# Patient Record
Sex: Male | Born: 1990 | Race: White | Hispanic: No | Marital: Single | Smoking: Never smoker
Health system: Southern US, Community
[De-identification: ages and names within clinical notes are randomized; demographics above are authoritative.]

## PROBLEM LIST (undated history)

## (undated) DIAGNOSIS — F419 Anxiety disorder, unspecified: Secondary | ICD-10-CM

## (undated) DIAGNOSIS — F32A Depression, unspecified: Secondary | ICD-10-CM

## (undated) DIAGNOSIS — F329 Major depressive disorder, single episode, unspecified: Secondary | ICD-10-CM

## (undated) DIAGNOSIS — S069X9A Unspecified intracranial injury with loss of consciousness of unspecified duration, initial encounter: Secondary | ICD-10-CM

## (undated) DIAGNOSIS — S069XAA Unspecified intracranial injury with loss of consciousness status unknown, initial encounter: Secondary | ICD-10-CM

---

## 2016-04-29 ENCOUNTER — Emergency Department
Admission: EM | Admit: 2016-04-29 | Discharge: 2016-04-29 | Disposition: A | Discharge status: Home / Self Care | Payer: Medicare Other | Attending: Emergency Medicine | Admitting: Emergency Medicine

## 2016-04-29 ENCOUNTER — Encounter: Payer: Self-pay | Admitting: *Deleted

## 2016-04-29 DIAGNOSIS — Y92009 Unspecified place in unspecified non-institutional (private) residence as the place of occurrence of the external cause: Secondary | ICD-10-CM | POA: Insufficient documentation

## 2016-04-29 DIAGNOSIS — Y999 Unspecified external cause status: Secondary | ICD-10-CM | POA: Diagnosis not present

## 2016-04-29 DIAGNOSIS — F419 Anxiety disorder, unspecified: Secondary | ICD-10-CM | POA: Insufficient documentation

## 2016-04-29 DIAGNOSIS — F329 Major depressive disorder, single episode, unspecified: Secondary | ICD-10-CM | POA: Insufficient documentation

## 2016-04-29 DIAGNOSIS — Y9389 Activity, other specified: Secondary | ICD-10-CM | POA: Insufficient documentation

## 2016-04-29 HISTORY — DX: Anxiety disorder, unspecified: F41.9

## 2016-04-29 HISTORY — DX: Major depressive disorder, single episode, unspecified: F32.9

## 2016-04-29 HISTORY — DX: Depression, unspecified: F32.A
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
## 2016-04-29 NOTE — ED Notes (Signed)
Resumed c are from BJ'sStephen RN.  Pt alert, calm and cooperative.  Pt on cell phone.  Pt is dressed and is waiting on transportation to group home per stephen rn report.

## 2016-04-29 NOTE — ED Notes (Signed)
Pt continues to wait on ride to group home.  Pt is on cell phone.
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
## 2016-04-29 NOTE — ED Notes (Signed)
D/c inst to group home caregiver and pt. Pt alert, calm and cooperative at time of discharge.
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
## 2016-11-26 ENCOUNTER — Emergency Department
Admission: EM | Admit: 2016-11-26 | Discharge: 2016-11-26 | Disposition: A | Discharge status: Home / Self Care | Payer: Medicare Other | Attending: Emergency Medicine | Admitting: Emergency Medicine

## 2016-11-26 ENCOUNTER — Emergency Department: Payer: Medicare Other

## 2016-11-26 DIAGNOSIS — R079 Chest pain, unspecified: Secondary | ICD-10-CM | POA: Diagnosis present

## 2016-11-26 LAB — BASIC METABOLIC PANEL
Anion gap: 6 (ref 5–15)
BUN: 12 mg/dL (ref 6–20)
CALCIUM: 9.6 mg/dL (ref 8.9–10.3)
CO2: 30 mmol/L (ref 22–32)
Chloride: 103 mmol/L (ref 101–111)
Creatinine, Ser: 0.78 mg/dL (ref 0.61–1.24)
GFR calc Af Amer: >60 mL/min (ref >60)
GFR calc non Af Amer: >60 mL/min (ref >60)
GLUCOSE: 79 mg/dL (ref 65–99)
Potassium: 4 mmol/L (ref 3.5–5.1)
Sodium: 139 mmol/L (ref 135–145)

## 2016-11-26 LAB — CBC
HCT: 44.2 % (ref 40.0–52.0)
Hemoglobin: 14.9 g/dL (ref 13.0–18.0)
MCH: 30.5 pg (ref 26.0–34.0)
MCHC: 33.7 g/dL (ref 32.0–36.0)
MCV: 90.6 fL (ref 80.0–100.0)
PLATELETS: 251 10*3/uL (ref 150–440)
RBC: 4.87 MIL/uL (ref 4.40–5.90)
RDW: 13.6 % (ref 11.5–14.5)
WBC: 6 10*3/uL (ref 3.8–10.6)

## 2016-11-26 LAB — TROPONIN I

## 2016-11-26 MED ORDER — RANITIDINE HCL 150 MG PO TABS: 150.0000 mg | ORAL_TABLET | Freq: Two times a day (BID) | ORAL | 1 refills | Status: DC

## 2016-11-26 NOTE — ED Provider Notes (Signed)
[name]   ____________________________________________   I have reviewed the triage vital signs and the nursing notes.   HISTORY  Chief Complaint Chest Pain   History limited by: Not Limited   HPI Eugene Silva is a 26 y.o. male who presents to the emergency department today because of chest pain. Is located in the left lower chest. He states it is been happening on and off for a few weeks. Today it happened 4 times. The pain is sharp. It is only around for 2-3 minutes. It is not accompanied by any shortness breath. No nausea or vomiting. Today's worse episode happen shortly after he ate some ice cream. He denies seen anyone for this in the past.   Past Medical History:  Diagnosis Date  . Anxiety   . Depression     There are no active problems to display for this patient.   History reviewed. No pertinent surgical history.  Prior to Admission medications   Not on File    Allergies Patient has no known allergies.  No family history on file.  Social History Social History  Substance Use Topics  . Smoking status: Never Smoker  . Smokeless tobacco: Not on file  . Alcohol use No    Review of Systems  Constitutional: Negative for fever. Cardiovascular: Positive for chest pain. Respiratory: Negative for shortness of breath. Gastrointestinal: Negative for abdominal pain, vomiting and diarrhea. Neurological: Negative for headaches, focal weakness or numbness.  10-point ROS otherwise negative.  ____________________________________________   PHYSICAL EXAM:  VITAL SIGNS: ED Triage Vitals  Enc Vitals Group     BP 11/26/16 1628 122/76     Pulse Rate 11/26/16 1628 81     Resp 11/26/16 1628 18     Temp 11/26/16 1628 98.2 F (36.8 C)     Temp Source 11/26/16 1628 Oral     SpO2 11/26/16 1628 98 %     Weight 11/26/16 1628 150 lb (68 kg)     Height 11/26/16 1628 5\' 7"  (1.702 m)     Head  Circumference --      Peak Flow --      Pain Score 11/26/16 1738 0     Pain Loc --     Constitutional: Alert and oriented. Well appearing and in no distress. Eyes: Conjunctivae are normal. Normal extraocular movements. ENT   Head: Normocephalic and atraumatic.   Nose: No congestion/rhinnorhea.   Mouth/Throat: Mucous membranes are moist.   Neck: No stridor. Hematological/Lymphatic/Immunilogical: No cervical lymphadenopathy. Cardiovascular: Normal rate, regular rhythm.  No murmurs, rubs, or gallops.  Respiratory: Normal respiratory effort without tachypnea nor retractions. Breath sounds are clear and equal bilaterally. No wheezes/rales/rhonchi. Gastrointestinal: Soft and non tender. No rebound. No guarding.  Genitourinary: Deferred Musculoskeletal: Normal range of motion in all extremities. No lower extremity edema. Neurologic:  Normal speech and language. No gross focal neurologic deficits are appreciated.  Skin:  Skin is warm, dry and intact. No rash noted. Psychiatric: Mood and affect are normal. Speech and behavior are normal. Patient exhibits appropriate insight and judgment.  ____________________________________________    LABS (pertinent positives/negatives)  Labs Reviewed  BASIC METABOLIC PANEL  CBC  TROPONIN I     ____________________________________________   EKG  I, Phineas Semen, attending physician, personally viewed and interpreted this EKG  EKG Time: 1628 Rate: 71 Rhythm: normal sinus rhythm Axis: normal Intervals: qtc 415 QRS: narrow, LVH ST changes: no st elevation Impression: abnormal ekg   ____________________________________________  RADIOLOGY  CXR IMPRESSION: Normal exam.   ____________________________________________   PROCEDURES  Procedures  ____________________________________________   INITIAL IMPRESSION / ASSESSMENT AND PLAN / ED COURSE  Pertinent labs & imaging results that were available during my care  of the patient were reviewed by me and considered in my medical decision making (see chart for details).  Patient presented to the emergency department today for intermittent sharp chest pain. EKG, CXR and blood work without concerning findings. I think at this point acid reflux likely. Doubt ACS, PE, PTX, PNA. Will discharge with antacid.   ____________________________________________   FINAL CLINICAL IMPRESSION(S) / ED DIAGNOSES  Final diagnoses:  Chest pain, unspecified type     Note: This dictation was prepared with Dragon dictation. Any transcriptional errors that result from this process are unintentional     Phineas SemenGraydon Tyquavious Gamel, MD 11/26/16 [id number]

## 2016-11-26 NOTE — Discharge Instructions (Signed)
Please seek medical attention for any high fevers, chest pain, shortness of breath, change in behavior, persistent vomiting, bloody stool or any other new or concerning symptoms.  

## 2016-11-26 NOTE — ED Triage Notes (Signed)
Pt c/o CP intermittently since this AM. Pt alert and oriented X4, active, cooperative, pt in NAD. RR even and unlabored, color WNL.  Denies pain at this time.

## 2018-11-18 ENCOUNTER — Emergency Department
Admission: EM | Admit: 2018-11-18 | Discharge: 2018-11-18 | Disposition: A | Discharge status: Home / Self Care | Payer: Medicare Other | Attending: Emergency Medicine | Admitting: Emergency Medicine

## 2018-11-18 ENCOUNTER — Emergency Department: Payer: Medicare Other

## 2018-11-18 ENCOUNTER — Other Ambulatory Visit: Payer: Self-pay

## 2018-11-18 DIAGNOSIS — Z79899 Other long term (current) drug therapy: Secondary | ICD-10-CM | POA: Insufficient documentation

## 2018-11-18 DIAGNOSIS — E041 Nontoxic single thyroid nodule: Secondary | ICD-10-CM

## 2018-11-18 DIAGNOSIS — S0990XA Unspecified injury of head, initial encounter: Secondary | ICD-10-CM | POA: Insufficient documentation

## 2018-11-18 DIAGNOSIS — Y92513 Shop (commercial) as the place of occurrence of the external cause: Secondary | ICD-10-CM | POA: Insufficient documentation

## 2018-11-18 DIAGNOSIS — Y998 Other external cause status: Secondary | ICD-10-CM | POA: Diagnosis not present

## 2018-11-18 DIAGNOSIS — Z23 Encounter for immunization: Secondary | ICD-10-CM | POA: Diagnosis not present

## 2018-11-18 DIAGNOSIS — R079 Chest pain, unspecified: Secondary | ICD-10-CM | POA: Diagnosis not present

## 2018-11-18 DIAGNOSIS — M542 Cervicalgia: Secondary | ICD-10-CM | POA: Insufficient documentation

## 2018-11-18 DIAGNOSIS — Y939 Activity, unspecified: Secondary | ICD-10-CM | POA: Insufficient documentation

## 2018-11-18 HISTORY — DX: Unspecified intracranial injury with loss of consciousness status unknown, initial encounter: S06.9XAA

## 2018-11-18 HISTORY — DX: Unspecified intracranial injury with loss of consciousness of unspecified duration, initial encounter: S06.9X9A

## 2018-11-18 MED ORDER — TETANUS-DIPHTH-ACELL PERTUSSIS 5-2.5-18.5 LF-MCG/0.5 IM SUSP
0.5000 mL | Freq: Once | INTRAMUSCULAR | Status: AC
Start: 2018-11-18 — End: 2018-11-18
  Administered 2018-11-18: 0.5 mL via INTRAMUSCULAR
  Filled 2018-11-18: qty 0.5

## 2018-11-18 MED ORDER — BACITRACIN-NEOMYCIN-POLYMYXIN 400-5-5000 EX OINT: 1.0000 "application " | TOPICAL_OINTMENT | Freq: Two times a day (BID) | CUTANEOUS | 0 refills | Status: AC

## 2018-11-18 MED ORDER — IBUPROFEN 600 MG PO TABS: 600.0000 mg | ORAL_TABLET | Freq: Four times a day (QID) | ORAL | 0 refills | Status: AC | PRN
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
## 2019-09-16 ENCOUNTER — Other Ambulatory Visit: Payer: Self-pay | Admitting: Otolaryngology

## 2019-09-16 DIAGNOSIS — E041 Nontoxic single thyroid nodule: Secondary | ICD-10-CM

## 2019-09-22 ENCOUNTER — Ambulatory Visit: Payer: Medicare Other

## 2019-09-30 ENCOUNTER — Ambulatory Visit: Payer: Medicare Other

## 2019-10-05 ENCOUNTER — Ambulatory Visit
Admission: RE | Admit: 2019-10-05 | Discharge: 2019-10-05 | Disposition: A | Discharge status: Home / Self Care | Payer: Medicare Other | Source: Ambulatory Visit | Attending: Otolaryngology | Admitting: Otolaryngology

## 2019-10-05 ENCOUNTER — Other Ambulatory Visit: Payer: Self-pay

## 2019-10-05 DIAGNOSIS — E041 Nontoxic single thyroid nodule: Secondary | ICD-10-CM | POA: Diagnosis present

## 2019-10-07 ENCOUNTER — Other Ambulatory Visit: Payer: Self-pay | Admitting: Otolaryngology

## 2019-10-07 DIAGNOSIS — E041 Nontoxic single thyroid nodule: Secondary | ICD-10-CM

## 2019-10-17 ENCOUNTER — Ambulatory Visit: Payer: Medicare Other

## 2019-10-18 ENCOUNTER — Other Ambulatory Visit: Payer: Self-pay

## 2019-10-18 ENCOUNTER — Ambulatory Visit
Admission: RE | Admit: 2019-10-18 | Discharge: 2019-10-18 | Disposition: A | Discharge status: Home / Self Care | Payer: Medicare Other | Source: Ambulatory Visit | Attending: Otolaryngology | Admitting: Otolaryngology

## 2019-10-18 DIAGNOSIS — E041 Nontoxic single thyroid nodule: Secondary | ICD-10-CM | POA: Diagnosis not present

## 2019-10-18 NOTE — Procedures (Signed)
Interventional Radiology Procedure:   Indications: Indeterminate thyroid nodule  Procedure: US guided thyroid FNA  Findings: 5 FNAs from right nodule  Complications: none     EBL: less than 10 ml  Plan: Discharge to home.   Dera Vanaken R. Anselm Pancoast, MD  Pager: [phone]

## 2019-10-31 ENCOUNTER — Encounter: Payer: Self-pay | Admitting: Otolaryngology

## 2019-10-31 LAB — CYTOLOGY - NON PAP

## 2019-12-24 IMAGING — US US THYROID
1 series · 13 of 25 positions shown · non-contrast
Comparison: Cervical spine CT 11/18/2018

CLINICAL DATA: Thyroid nodule seen on cervical spine CT.

EXAM:
THYROID ULTRASOUND
TECHNIQUE: Ultrasound examination of the thyroid gland and adjacent soft
tissues was performed.

[Series 1: us thyroid · 0.07mm/px · 13 of 47 slices shown]
[im 1/47]
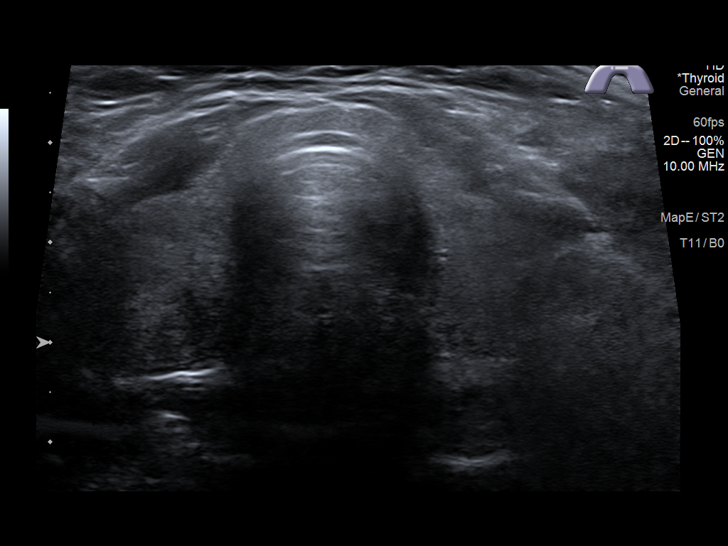
[im 4/47]
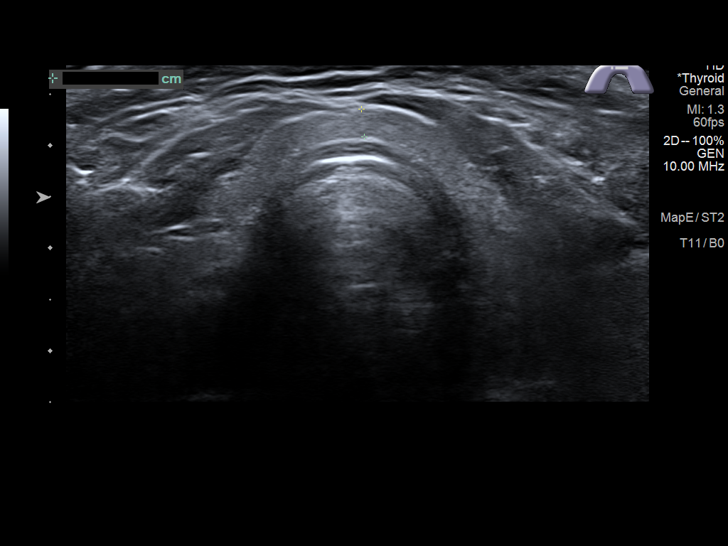
[im 8/47]
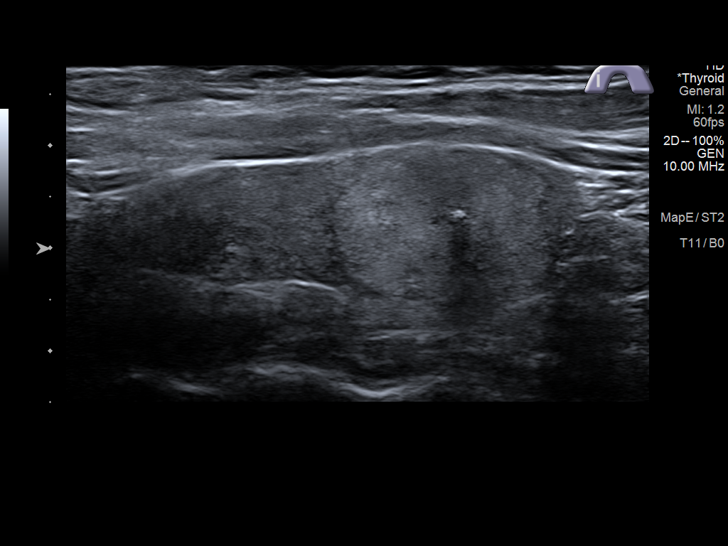
[im 12/47]
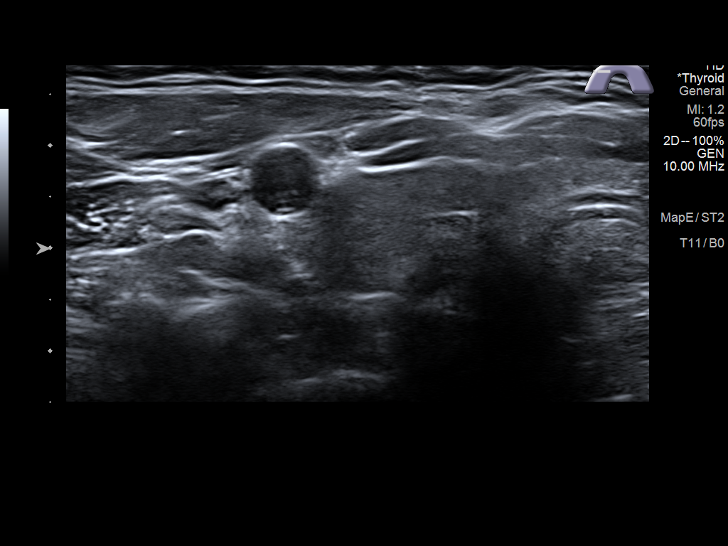
[im 16/47]
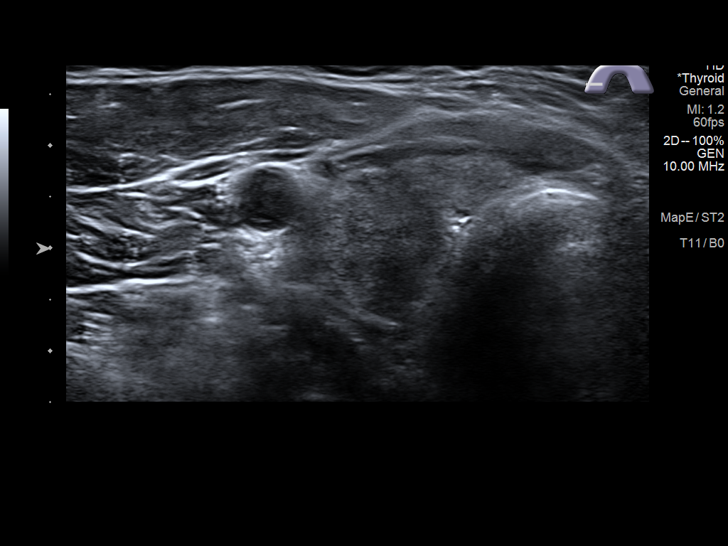
[im 20/47]
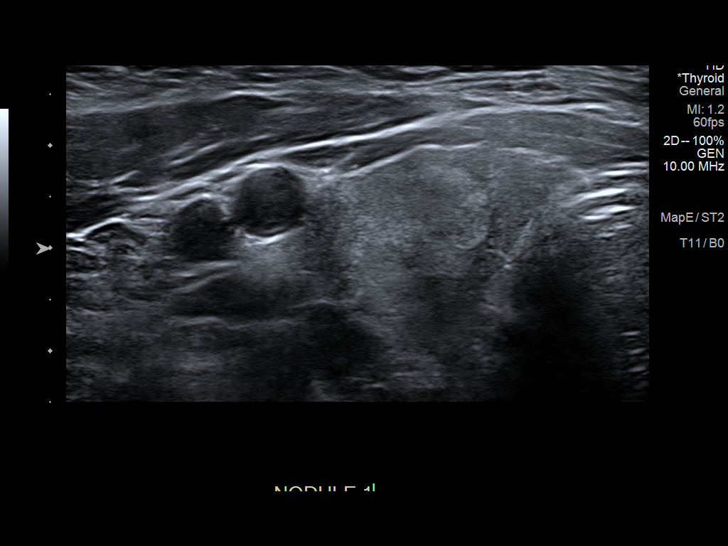
[im 24/47]
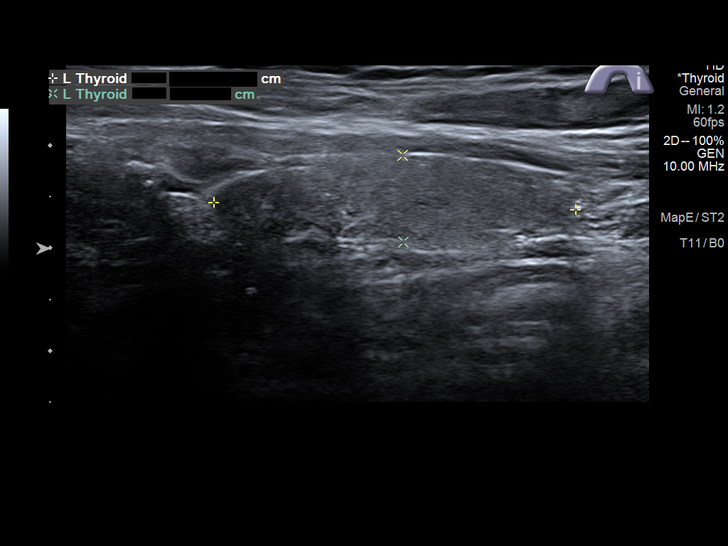
[im 27/47]
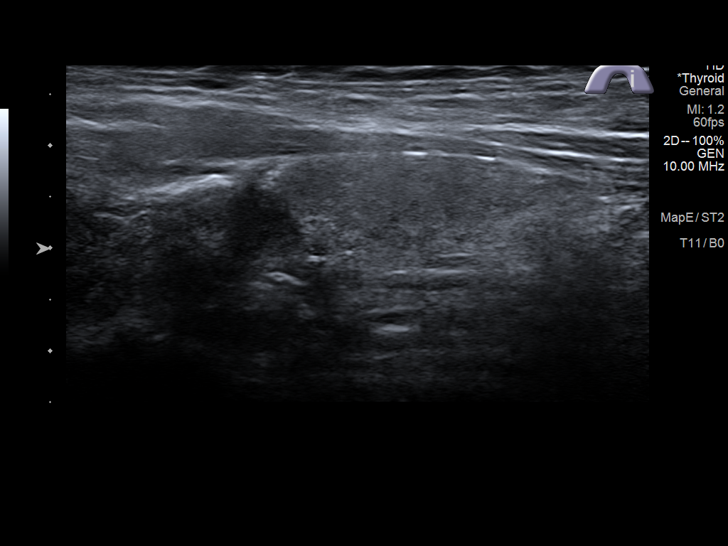
[im 31/47]
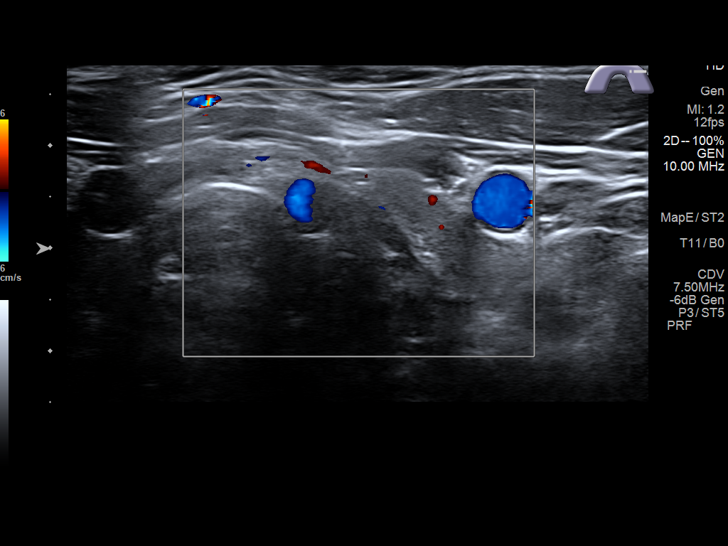
[im 35/47]
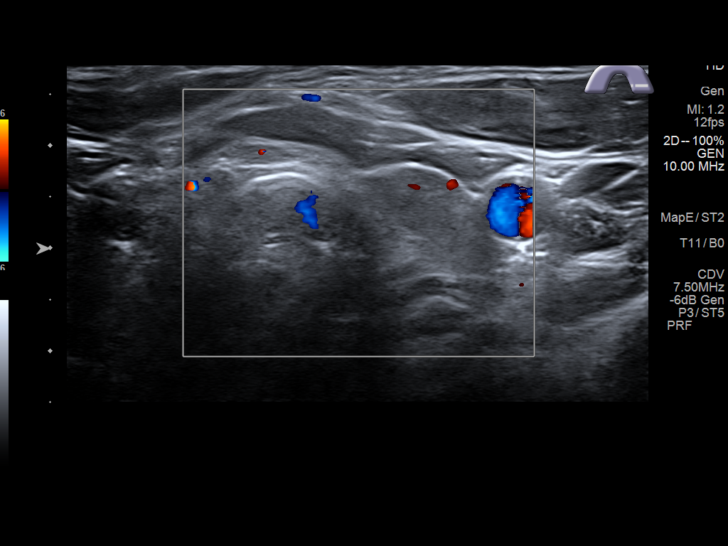
[im 39/47]
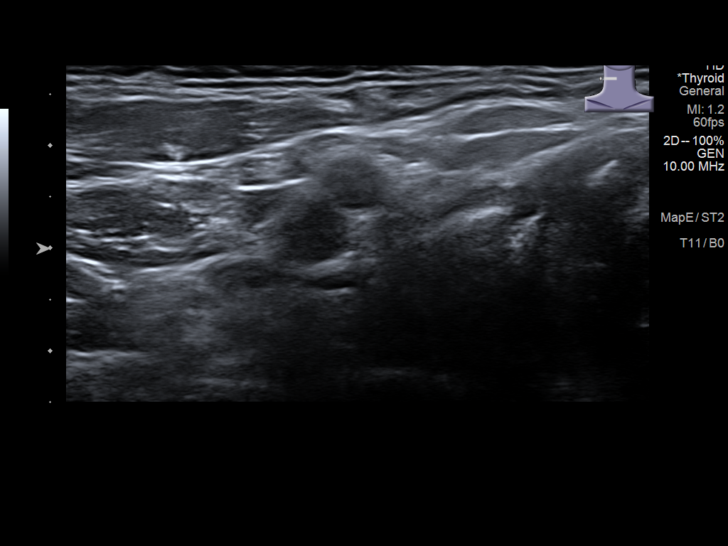
[im 43/47]
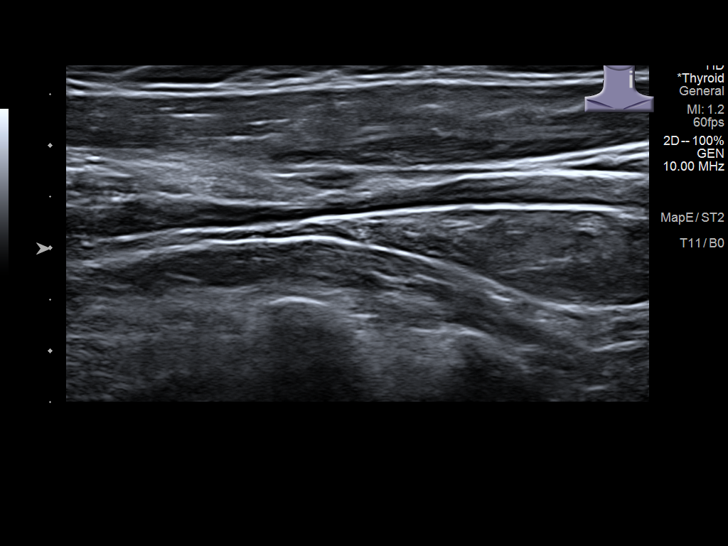
[im 47/47]
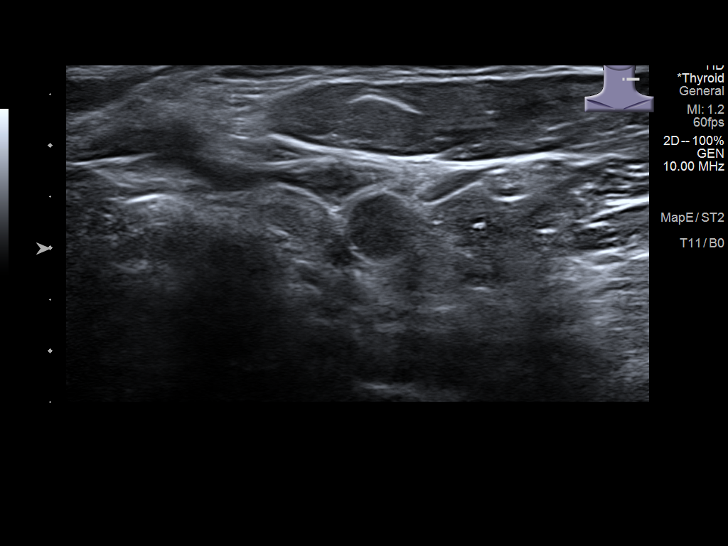

[13 of 25 positions shown; findings below may reference images not displayed]

FINDINGS: Parenchymal Echotexture: Normal

Isthmus: 0.3 cm

Right lobe: 4.0 x 1.4 x 1.8 cm

Left lobe: 3.5 x 0.8 x 1.3 cm

_________________________________________________________

Estimated total number of nodules >/= 1 cm: 1

Number of spongiform nodules >/=  2 cm not described below (TR1): 0

Number of mixed cystic and solid nodules >/= 1.5 cm not described
below (TR2): 0

_________________________________________________________

Nodule # 1:

Location: Right; Inferior

Maximum size: 2.1 cm; Other 2 dimensions: 1.5 x 1.7 cm

Composition: solid/almost completely solid (2)

Echogenicity: isoechoic (1)

Shape: not taller-than-wide (0)

Margins: ill-defined (0)

Echogenic foci: macrocalcifications (1)

ACR TI-RADS total points: 4.

ACR TI-RADS risk category: TR4 (4-6 points).

ACR TI-RADS recommendations:

**Given size (>/= 1.5 cm) and appearance, fine needle aspiration of
this moderately suspicious nodule should be considered based on
TI-RADS criteria.

_________________________________________________________

No discrete left thyroid nodule.
IMPRESSION: Solitary right thyroid nodule measuring up to 2.1 cm. This is a TR 4
nodule and meets criteria for ultrasound-guided biopsy.

The above is in keeping with the ACR TI-RADS recommendations - [HOSPITAL] 1619;[DATE].

## 2020-01-10 ENCOUNTER — Other Ambulatory Visit: Payer: Self-pay | Admitting: Otolaryngology

## 2020-01-10 DIAGNOSIS — E041 Nontoxic single thyroid nodule: Secondary | ICD-10-CM

## 2020-04-23 ENCOUNTER — Ambulatory Visit: Payer: Medicare Other | Attending: Otolaryngology

## 2020-05-02 ENCOUNTER — Ambulatory Visit
Admission: RE | Admit: 2020-05-02 | Discharge: 2020-05-02 | Disposition: A | Discharge status: Home / Self Care | Payer: Medicare Other | Source: Ambulatory Visit | Attending: Otolaryngology | Admitting: Otolaryngology

## 2020-05-02 ENCOUNTER — Other Ambulatory Visit: Payer: Self-pay

## 2020-05-02 DIAGNOSIS — E041 Nontoxic single thyroid nodule: Secondary | ICD-10-CM | POA: Insufficient documentation

## 2020-07-21 IMAGING — US US THYROID
1 series · 14 of 25 positions shown · non-contrast
Comparison: October 05, 2019

CLINICAL DATA: Thyroid nodule follow-up

EXAM:
THYROID ULTRASOUND
TECHNIQUE: Ultrasound examination of the thyroid gland and adjacent soft
tissues was performed.

[Series 1: us thyroid · 14 of 40 slices shown]
[im 1/40]
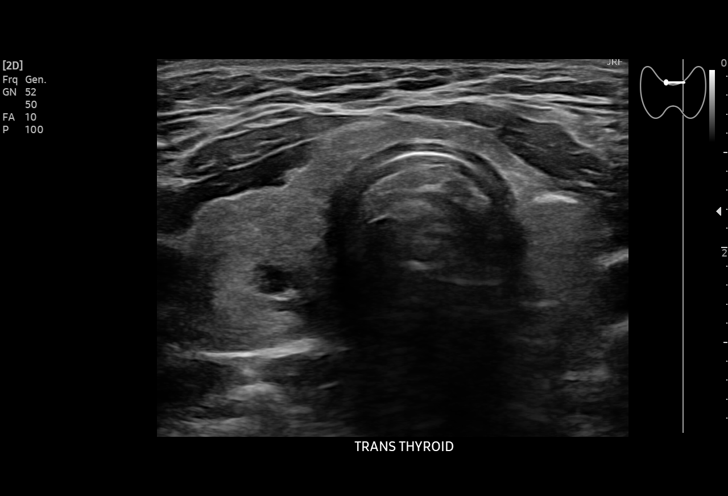
[im 4/40]
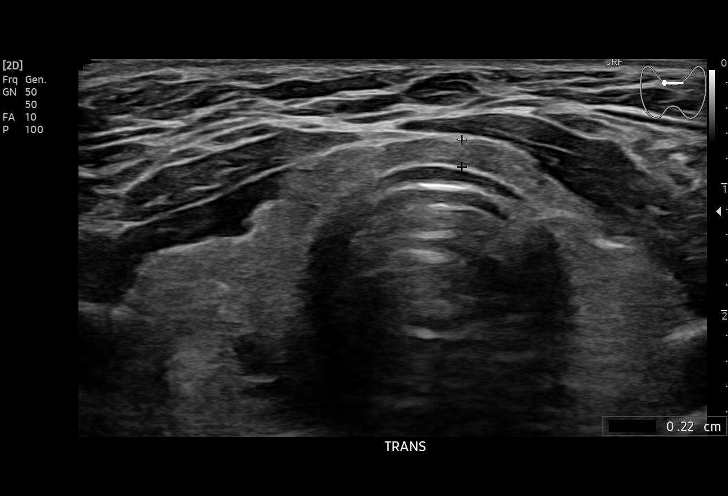
[im 7/40]
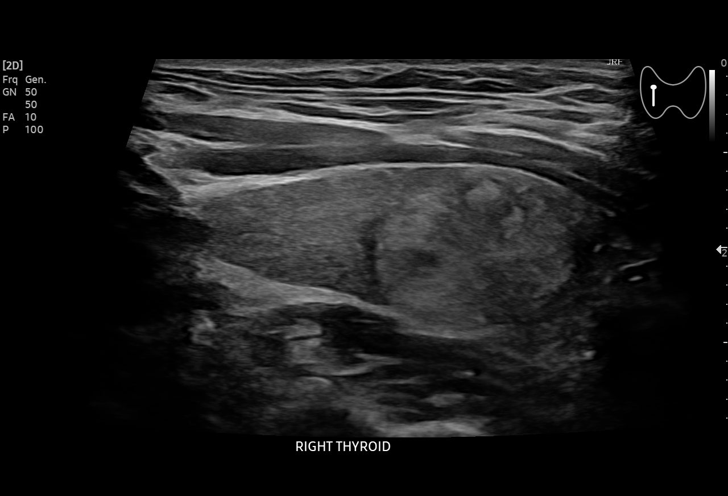
[im 10/40]
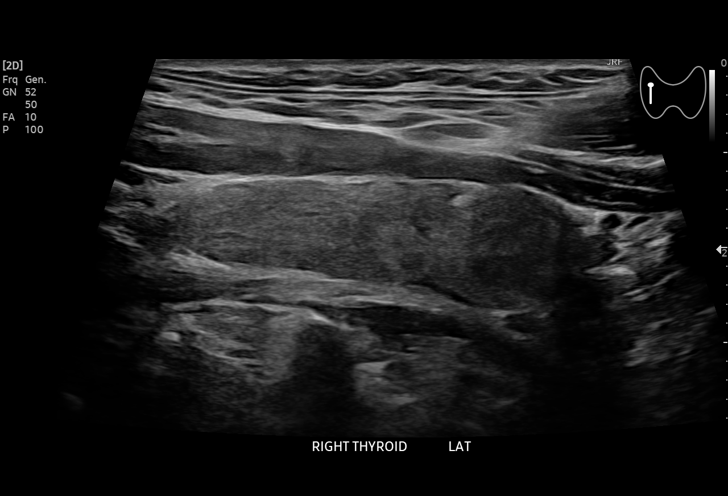
[im 14/40]
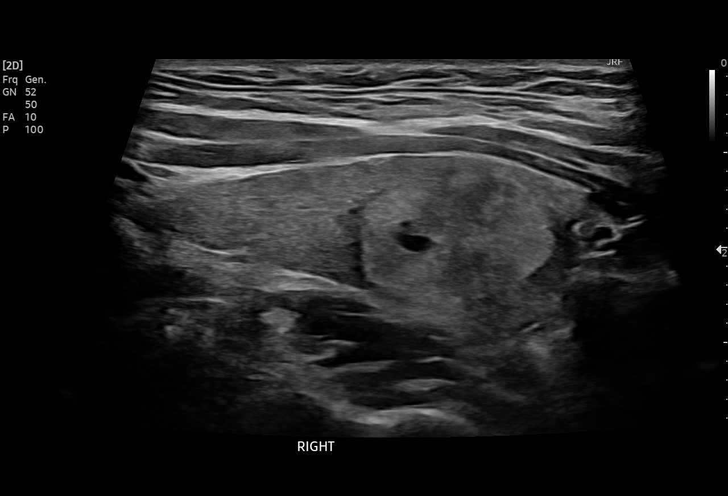
[im 15/40]
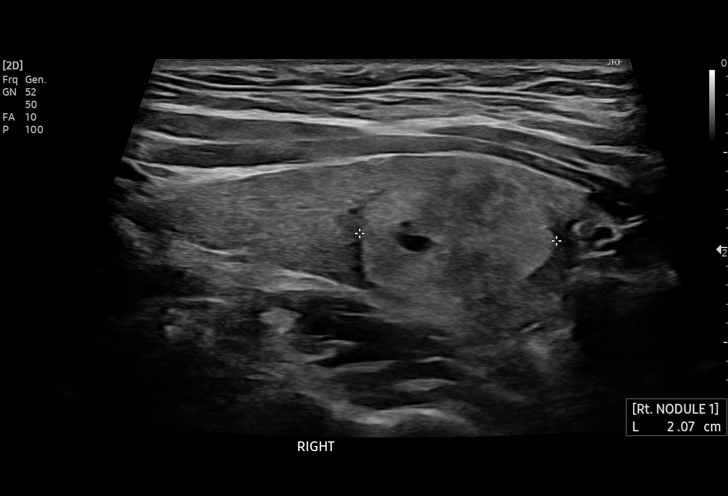
[im 18/40]
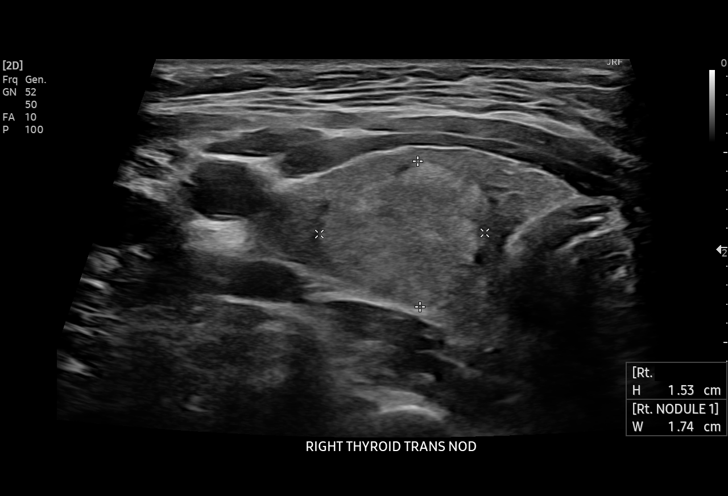
[im 22/40]
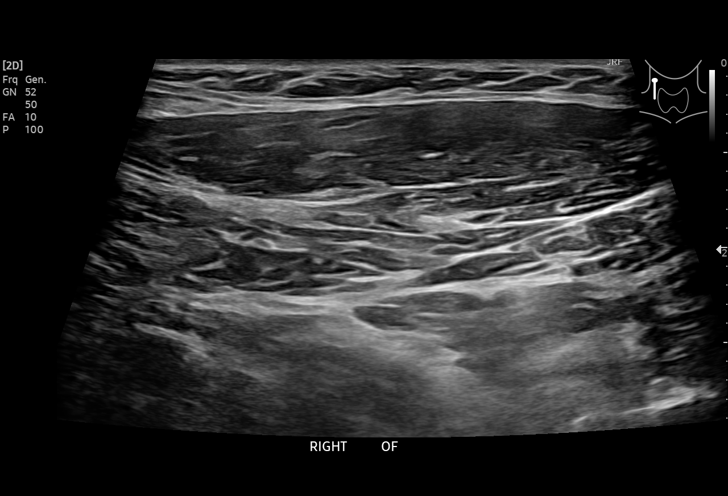
[im 25/40]
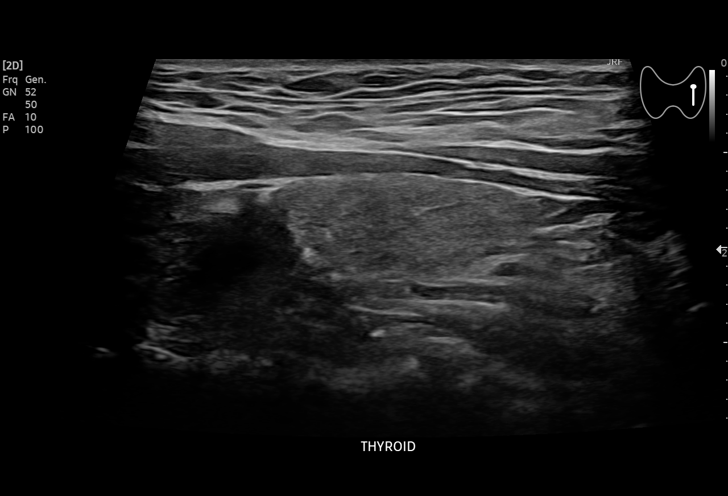
[im 27/40]
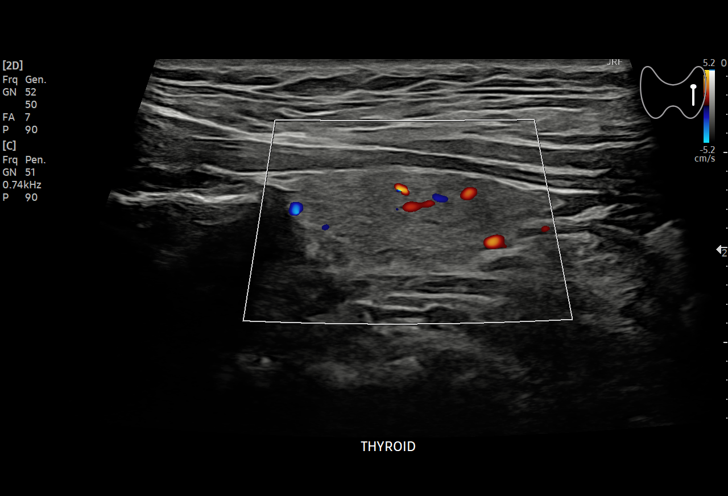
[im 30/40]
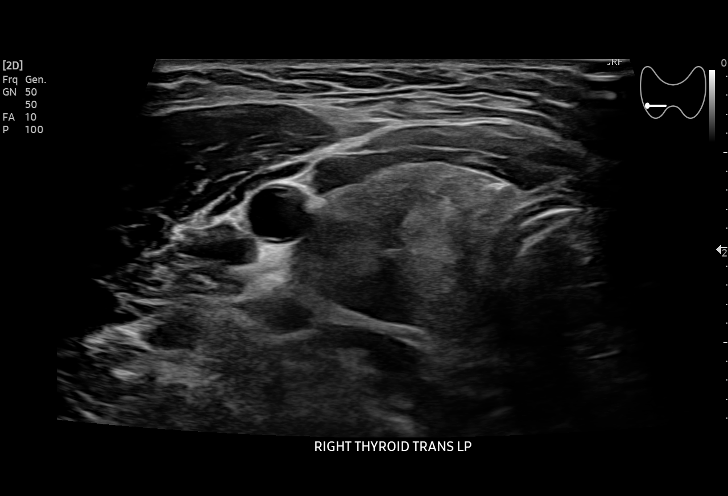
[im 33/40]
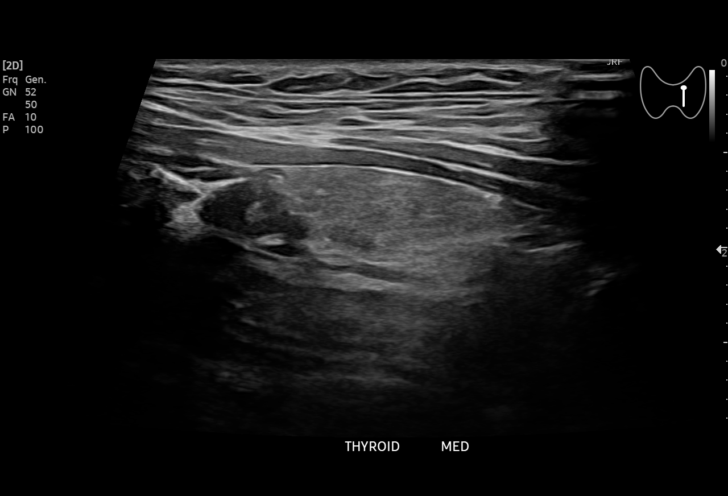
[im 36/40]
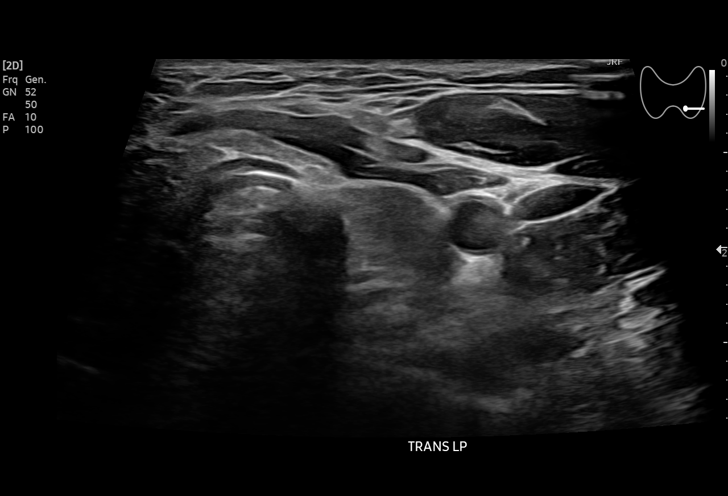
[im 40/40]
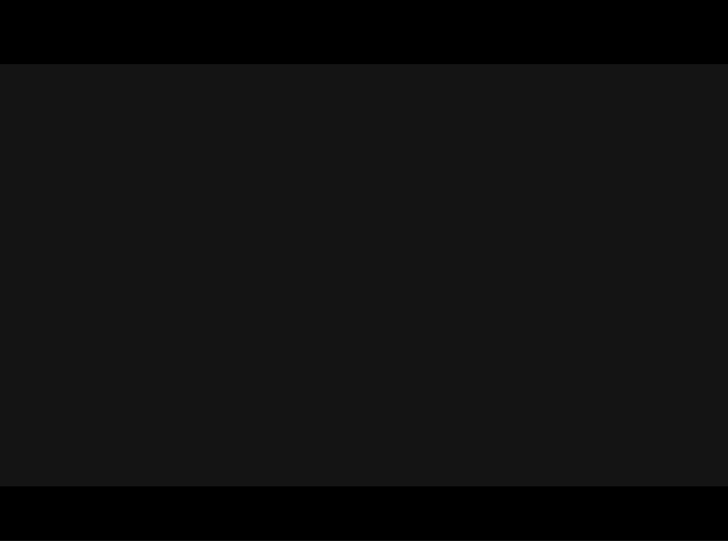

[14 of 25 positions shown; findings below may reference images not displayed]

FINDINGS: Parenchymal Echotexture: Mildly heterogenous

Isthmus: 0.2 cm

Right lobe: 4.4 x 1.8 x 2.2 cm

Left lobe: 2.9 x 1.1 x 1.2 cm

_________________________________________________________

Estimated total number of nodules >/= 1 cm: 1

Number of spongiform nodules >/=  2 cm not described below (TR1): 0

Number of mixed cystic and solid nodules >/= 1.5 cm not described
below (TR2): 0

_________________________________________________________

Nodule # 1:

Prior biopsy: Yes

Location: Right; Inferior

Maximum size: 2.1 cm; Other 2 dimensions: 1.7 x 1.5 cm, previously,
2.1 x 1.7 x 1.5 cm

Composition: solid/almost completely solid (2)

Echogenicity: isoechoic (1)

Shape: not taller-than-wide (0)

Margins: ill-defined (0)

Echogenic foci: none (0)

ACR TI-RADS total points: 3.

ACR TI-RADS risk category:  TR3 (3 points).

Significant change in size (>/= 20% in two dimensions and minimal
increase of 2 mm): No

Change in features: Yes

Change in ACR TI-RADS risk category: Yes

ACR TI-RADS recommendations:

*Given size (>/= 1.5 - 2.4 cm) and appearance, a follow-up
ultrasound in 1 year should be considered based on TI-RADS criteria.

_________________________________________________________

No new thyroid nodules were identified on today's study.
IMPRESSION: Stable 2.1 cm TR3 thyroid nodule in the right thyroid gland. This
thyroid nodule was previously biopsied. Correlation with prior
biopsy results is recommended. As the microcalcification is not well
visualized on today's study, the nodule has been down graded to a
TR3 nodule from TR4.

The above is in keeping with the ACR TI-RADS recommendations - [HOSPITAL] 2570;[DATE].

## 2020-11-06 DIAGNOSIS — K219 Gastro-esophageal reflux disease without esophagitis: Secondary | ICD-10-CM | POA: Diagnosis not present

## 2020-11-06 DIAGNOSIS — E781 Pure hyperglyceridemia: Secondary | ICD-10-CM | POA: Diagnosis not present

## 2020-11-06 DIAGNOSIS — E785 Hyperlipidemia, unspecified: Secondary | ICD-10-CM | POA: Diagnosis not present

## 2021-01-09 DIAGNOSIS — F39 Unspecified mood [affective] disorder: Secondary | ICD-10-CM | POA: Diagnosis not present

## 2021-01-09 DIAGNOSIS — E785 Hyperlipidemia, unspecified: Secondary | ICD-10-CM | POA: Diagnosis not present

## 2021-01-09 DIAGNOSIS — Z130 Encounter for screening for diseases of the blood and blood-forming organs and certain disorders involving the immune mechanism: Secondary | ICD-10-CM | POA: Diagnosis not present

## 2021-01-09 DIAGNOSIS — Z0000 Encounter for general adult medical examination without abnormal findings: Secondary | ICD-10-CM | POA: Diagnosis not present

## 2021-04-04 DIAGNOSIS — K219 Gastro-esophageal reflux disease without esophagitis: Secondary | ICD-10-CM | POA: Diagnosis not present

## 2022-03-21 ENCOUNTER — Other Ambulatory Visit: Payer: Self-pay | Admitting: Otolaryngology

## 2022-03-21 DIAGNOSIS — E041 Nontoxic single thyroid nodule: Secondary | ICD-10-CM

## 2022-03-24 ENCOUNTER — Other Ambulatory Visit: Payer: Self-pay | Admitting: Otolaryngology

## 2022-03-24 DIAGNOSIS — E041 Nontoxic single thyroid nodule: Secondary | ICD-10-CM

## 2022-03-25 ENCOUNTER — Ambulatory Visit
Admission: RE | Admit: 2022-03-25 | Discharge: 2022-03-25 | Disposition: A | Discharge status: Home / Self Care | Payer: Medicare HMO | Source: Ambulatory Visit | Attending: Otolaryngology | Admitting: Otolaryngology

## 2022-03-25 ENCOUNTER — Encounter (INDEPENDENT_AMBULATORY_CARE_PROVIDER_SITE_OTHER): Payer: Self-pay

## 2022-03-25 DIAGNOSIS — E041 Nontoxic single thyroid nodule: Secondary | ICD-10-CM

## 2022-06-13 IMAGING — US US THYROID
1 series · 13 of 25 positions shown · non-contrast
Comparison: 1413, 4343

CLINICAL DATA: Prior ultrasound follow-up.

EXAM:
THYROID ULTRASOUND
TECHNIQUE: Ultrasound examination of the thyroid gland and adjacent soft
tissues was performed.

[Series 1: us thyroid · 0.07mm/px · 13 of 60 slices shown]
[im 1/60]
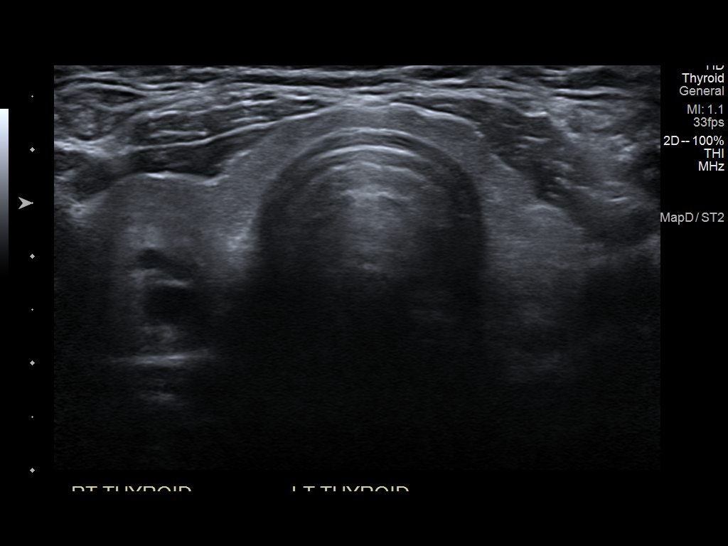
[im 5/60]
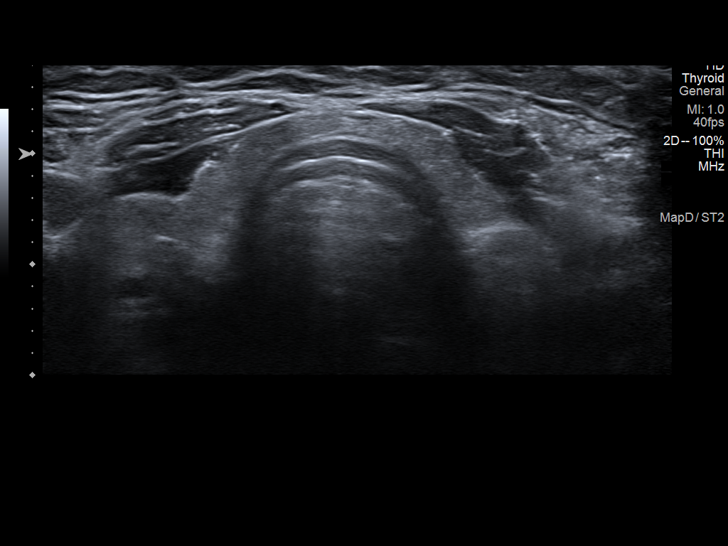
[im 10/60]
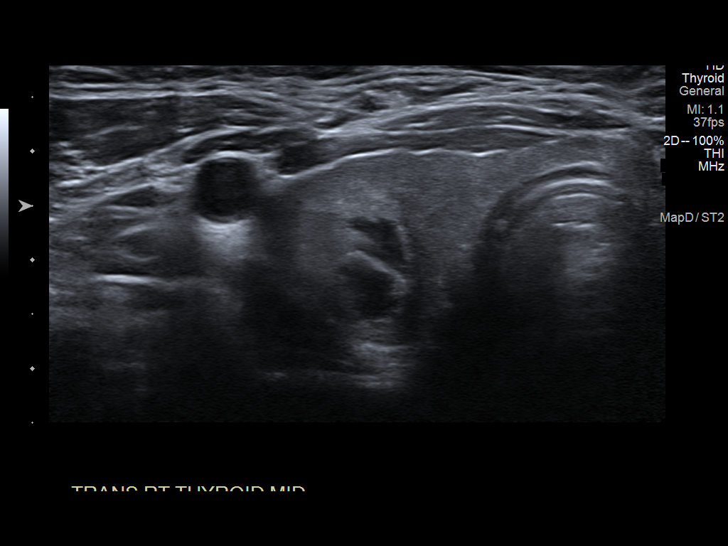
[im 15/60]
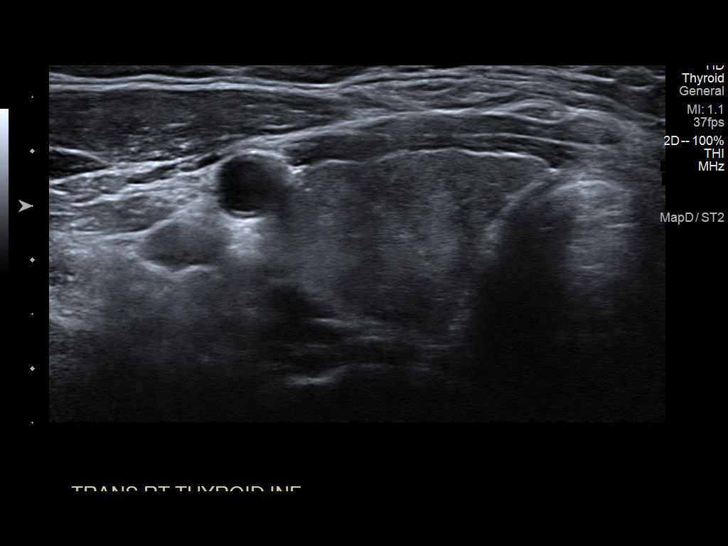
[im 20/60]
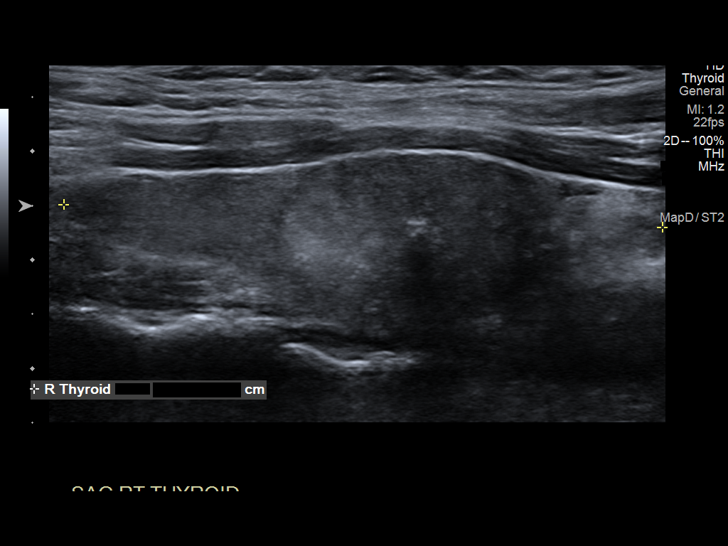
[im 25/60]
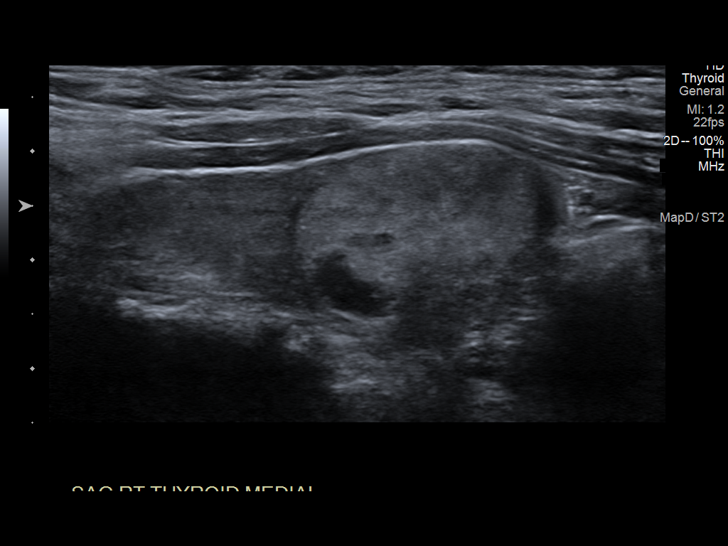
[im 30/60]
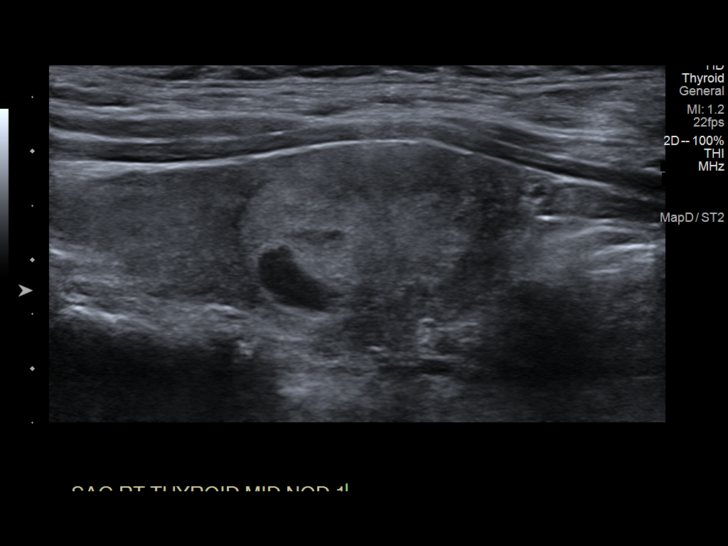
[im 35/60]
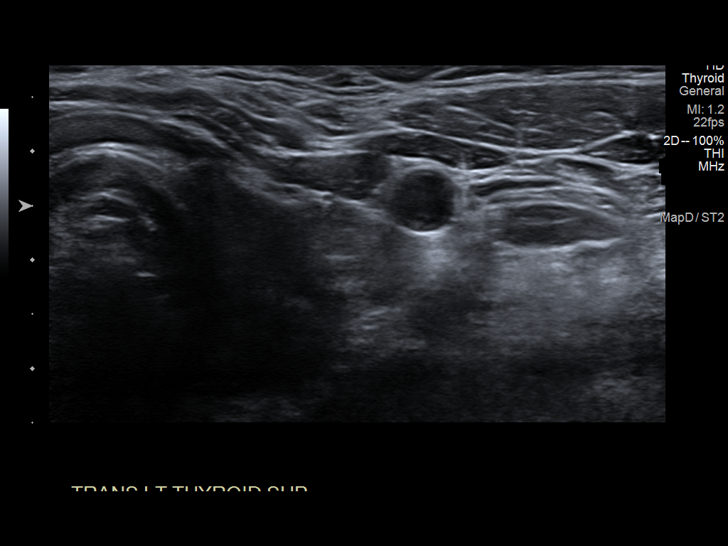
[im 40/60]
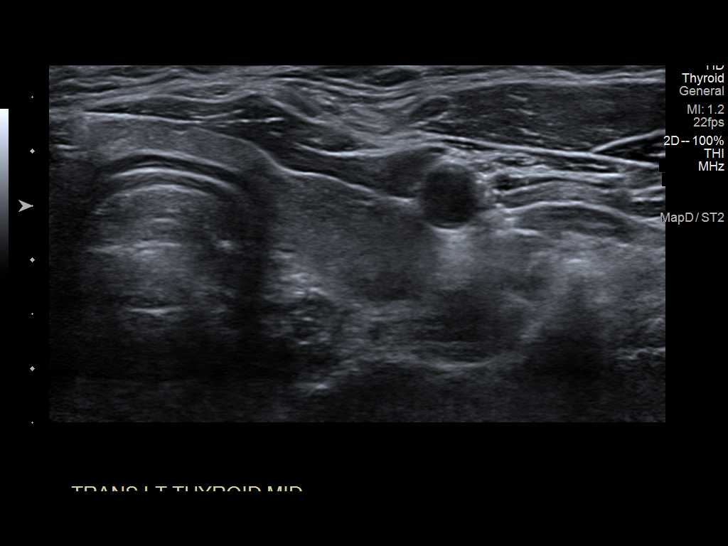
[im 45/60]
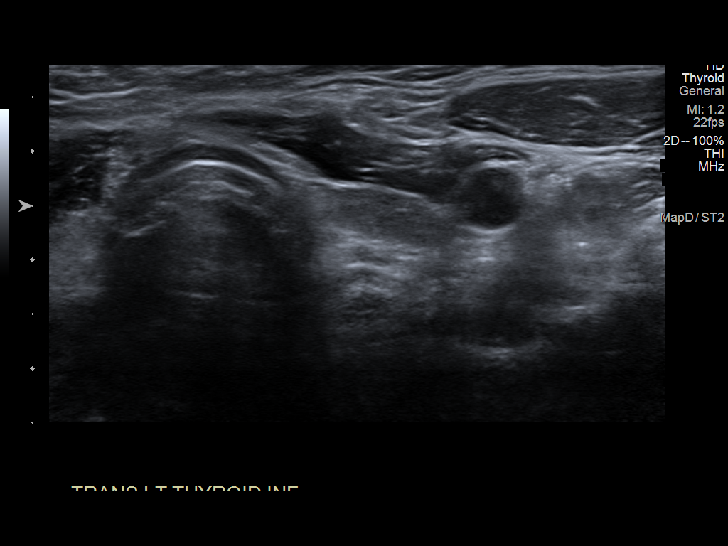
[im 50/60]
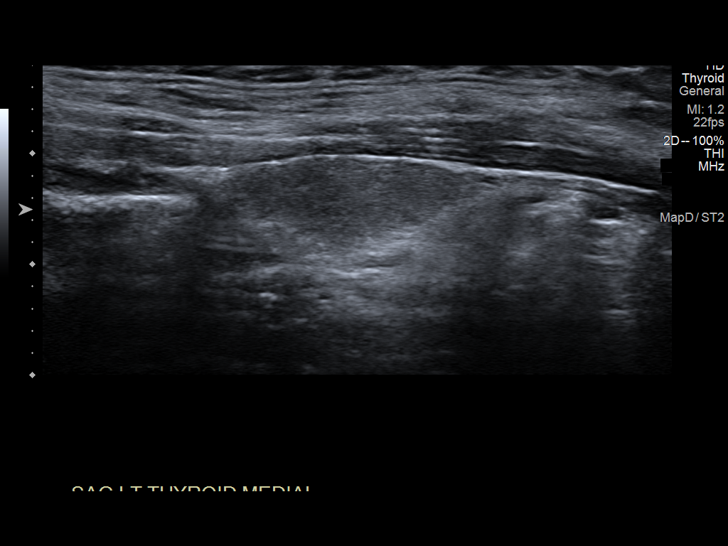
[im 55/60]
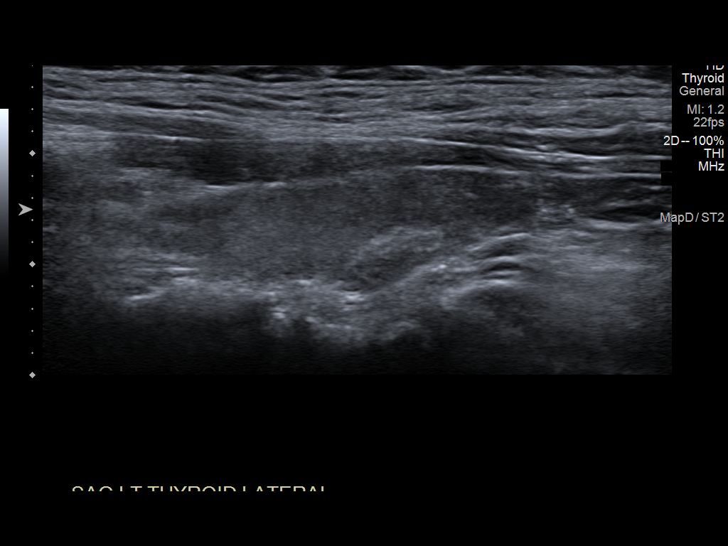
[im 60/60]
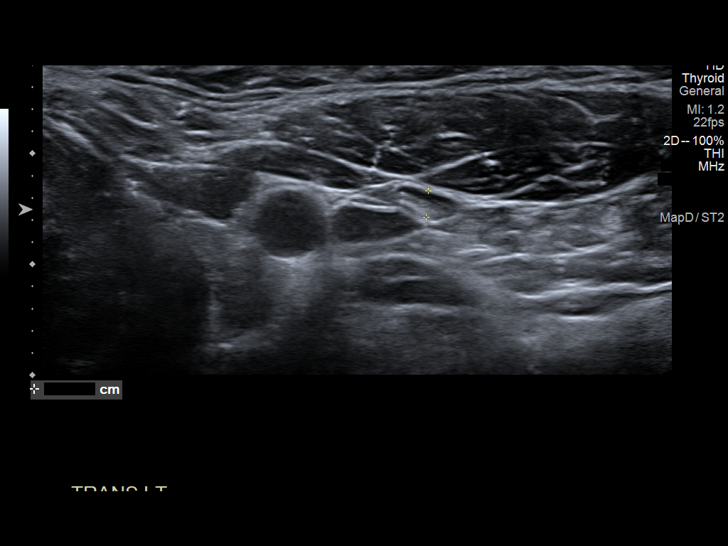

[13 of 25 positions shown; findings below may reference images not displayed]

FINDINGS: Parenchymal Echotexture: Normal

Isthmus: 0.2 cm

Right lobe: 5.5 x 2.0 x 2.1 cm

Left lobe: 3.9 x 1.2 x 1.6 cm

_________________________________________________________

Estimated total number of nodules >/= 1 cm: 1

Number of spongiform nodules >/=  2 cm not described below (TR1): 0

Number of mixed cystic and solid nodules >/= 1.5 cm not described
below (TR2): 0

_________________________________________________________

Nodule labeled 1 is a solid isoechoic nodule in the inferior right
thyroid 2.3 x 1.6 x 1.5 cm, previously 2.1 cm in 1413 and 4343. this
nodule was previously biopsied. It is similar to minimally increased
in size, with unchanged morphology.
IMPRESSION: Solitary nodule in the inferior right thyroid lobe is similar to
minimally increased in size measuring 2.3 cm on today's exam,
previously 2.1 cm, and otherwise similar in appearance. This nodule
was previously biopsied, correlate with biopsy results.

No new thyroid nodules.

The above is in keeping with the ACR TI-RADS recommendations - [HOSPITAL] 3137;[DATE].

## 2022-11-20 ENCOUNTER — Other Ambulatory Visit: Payer: Self-pay | Admitting: Otolaryngology

## 2022-11-20 DIAGNOSIS — E041 Nontoxic single thyroid nodule: Secondary | ICD-10-CM

## 2023-03-26 ENCOUNTER — Other Ambulatory Visit: Payer: Medicare HMO

## 2023-04-14 ENCOUNTER — Ambulatory Visit
Admission: RE | Admit: 2023-04-14 | Discharge: 2023-04-14 | Disposition: A | Discharge status: Home / Self Care | Payer: Medicare HMO | Source: Ambulatory Visit | Attending: Otolaryngology | Admitting: Otolaryngology

## 2023-04-14 DIAGNOSIS — E041 Nontoxic single thyroid nodule: Secondary | ICD-10-CM

## 2023-11-28 ENCOUNTER — Observation Stay
Admission: EM | Admit: 2023-11-28 | Discharge: 2023-11-30 | Disposition: A | Discharge status: Home / Self Care | Payer: Medicare HMO | Attending: Internal Medicine | Admitting: Internal Medicine

## 2023-11-28 ENCOUNTER — Other Ambulatory Visit: Payer: Self-pay

## 2023-11-28 ENCOUNTER — Emergency Department: Payer: Medicare HMO

## 2023-11-28 DIAGNOSIS — T183XXA Foreign body in small intestine, initial encounter: Principal | ICD-10-CM

## 2023-11-28 DIAGNOSIS — Z1152 Encounter for screening for COVID-19: Secondary | ICD-10-CM | POA: Diagnosis not present

## 2023-11-28 DIAGNOSIS — W44E3XA Non-magnetic metal toy entering into or through a natural orifice, initial encounter: Secondary | ICD-10-CM | POA: Diagnosis not present

## 2023-11-28 DIAGNOSIS — R112 Nausea with vomiting, unspecified: Secondary | ICD-10-CM | POA: Diagnosis not present

## 2023-11-28 DIAGNOSIS — R197 Diarrhea, unspecified: Secondary | ICD-10-CM | POA: Diagnosis not present

## 2023-11-28 DIAGNOSIS — R1033 Periumbilical pain: Principal | ICD-10-CM

## 2023-11-28 DIAGNOSIS — R109 Unspecified abdominal pain: Secondary | ICD-10-CM | POA: Diagnosis present

## 2023-11-28 LAB — URINALYSIS, ROUTINE W REFLEX MICROSCOPIC
Bacteria, UA: NONE SEEN
Bilirubin Urine: NEGATIVE
Glucose, UA: NEGATIVE mg/dL
Ketones, ur: NEGATIVE mg/dL
Leukocytes,Ua: NEGATIVE
Nitrite: NEGATIVE
Protein, ur: NEGATIVE mg/dL
Specific Gravity, Urine: >1.046 — ABNORMAL HIGH (ref 1.005–1.030)
pH: 5 (ref 5.0–8.0)

## 2023-11-28 LAB — HIV ANTIBODY (ROUTINE TESTING W REFLEX): HIV Screen 4th Generation wRfx: NONREACTIVE

## 2023-11-28 LAB — LIPASE, BLOOD: Lipase: 24 U/L (ref 11–51)

## 2023-11-28 LAB — COMPREHENSIVE METABOLIC PANEL
ALT: 36 U/L (ref 0–44)
AST: 27 U/L (ref 15–41)
Albumin: 4.5 g/dL (ref 3.5–5.0)
Alkaline Phosphatase: 68 U/L (ref 38–126)
Anion gap: 18 — ABNORMAL HIGH (ref 5–15)
BUN: 18 mg/dL (ref 6–20)
CO2: 20 mmol/L — ABNORMAL LOW (ref 22–32)
Calcium: 10.3 mg/dL (ref 8.9–10.3)
Chloride: 101 mmol/L (ref 98–111)
Creatinine, Ser: 1.23 mg/dL (ref 0.61–1.24)
GFR, Estimated: >60 mL/min (ref >60)
Glucose, Bld: 116 mg/dL — ABNORMAL HIGH (ref 70–99)
Potassium: 3.6 mmol/L (ref 3.5–5.1)
Sodium: 139 mmol/L (ref 135–145)
Total Bilirubin: 1 mg/dL (ref 0.0–1.2)
Total Protein: 8.8 g/dL — ABNORMAL HIGH (ref 6.5–8.1)

## 2023-11-28 LAB — RESP PANEL BY RT-PCR (RSV, FLU A&B, COVID)  RVPGX2
Influenza A by PCR: NEGATIVE
Influenza B by PCR: NEGATIVE
Resp Syncytial Virus by PCR: NEGATIVE
SARS Coronavirus 2 by RT PCR: NEGATIVE

## 2023-11-28 LAB — CBC
HCT: 48.4 % (ref 39.0–52.0)
Hemoglobin: 15.7 g/dL (ref 13.0–17.0)
MCH: 28.6 pg (ref 26.0–34.0)
MCHC: 32.4 g/dL (ref 30.0–36.0)
MCV: 88.2 fL (ref 80.0–100.0)
Platelets: 487 10*3/uL — ABNORMAL HIGH (ref 150–400)
RBC: 5.49 MIL/uL (ref 4.22–5.81)
RDW: 13.8 % (ref 11.5–15.5)
WBC: 12.2 10*3/uL — ABNORMAL HIGH (ref 4.0–10.5)
nRBC: 0 % (ref 0.0–0.2)

## 2023-11-28 MED ORDER — SODIUM CHLORIDE 0.9 % IV SOLN: INTRAVENOUS | Status: AC

## 2023-11-28 MED ORDER — IOHEXOL 300 MG/ML  SOLN
100.0000 mL | Freq: Once | INTRAMUSCULAR | Status: AC | PRN
  Administered 2023-11-28: 100 mL via INTRAVENOUS

## 2023-11-28 MED ORDER — ACETAMINOPHEN 650 MG RE SUPP: 650.0000 mg | Freq: Four times a day (QID) | RECTAL | Status: DC | PRN

## 2023-11-28 MED ORDER — SODIUM CHLORIDE 0.9 % IV BOLUS
1000.0000 mL | Freq: Once | INTRAVENOUS | Status: AC
  Administered 2023-11-28: 1000 mL via INTRAVENOUS

## 2023-11-28 MED ORDER — POLYETHYLENE GLYCOL 3350 17 G PO PACK: 17.0000 g | PACK | Freq: Every day | ORAL | Status: DC | PRN

## 2023-11-28 MED ORDER — FAMOTIDINE 20 MG PO TABS
20.0000 mg | ORAL_TABLET | Freq: Every day | ORAL | Status: DC
  Administered 2023-11-28 – 2023-11-30 (×3): 20 mg via ORAL
  Filled 2023-11-28 (×3): qty 1

## 2023-11-28 MED ORDER — ONDANSETRON HCL 4 MG/2ML IJ SOLN: 4.0000 mg | Freq: Four times a day (QID) | INTRAMUSCULAR | Status: DC | PRN

## 2023-11-28 MED ORDER — ACETAMINOPHEN 325 MG PO TABS
650.0000 mg | ORAL_TABLET | Freq: Four times a day (QID) | ORAL | Status: DC | PRN
Start: 2023-11-28 — End: 2023-11-30

## 2023-11-28 MED ORDER — ONDANSETRON HCL 4 MG/2ML IJ SOLN
4.0000 mg | INTRAMUSCULAR | Status: AC
  Administered 2023-11-28: 4 mg via INTRAVENOUS
  Filled 2023-11-28: qty 2

## 2023-11-28 MED ORDER — KETOROLAC TROMETHAMINE 30 MG/ML IJ SOLN
30.0000 mg | Freq: Four times a day (QID) | INTRAMUSCULAR | Status: DC | PRN
  Administered 2023-11-28: 30 mg via INTRAVENOUS
  Filled 2023-11-28: qty 1

## 2023-11-28 MED ORDER — ONDANSETRON 4 MG PO TBDP
4.0000 mg | ORAL_TABLET | Freq: Once | ORAL | Status: AC | PRN
  Administered 2023-11-28: 4 mg via ORAL
  Filled 2023-11-28: qty 1

## 2023-11-28 NOTE — ED Triage Notes (Signed)
Pt to ed from home via [facility] for N/V/D and abd pain x 2 days.   EMS vitals 104 HR  96% RA 142/92 98.4 T

## 2023-11-28 NOTE — ED Notes (Signed)
Pt resting quietly on left side. IV infusion continues. Pt denies any need. CB remains within reach.

## 2023-11-28 NOTE — ED Provider Notes (Signed)
-----------------------------------------   10:06 AM on 11/28/2023 ----------------------------------------- Patient care assumed from Dr. York Cerise.  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  I have discussed this with the patient, he denies ingesting any foreign bodies, but questionable historian.  I spoke to GI medicine as well as general surgery.  There is no obvious significant obstruction however there does appear to be slightly dilated small bowel loops on my evaluation of the images especially in the distal small bowel.  Will admit to the hospital service for continued monitoring and serial x-rays to ensure that the metallic object passes the ileocecal valve.   Minna Antis, MD 11/28/23 1008

## 2023-11-28 NOTE — H&P (Signed)
History and Physical    Eugene Silva NFA:[id number] DOB: 1991/04/27 DOA: 11/28/2023  PCP: [facility], [facility] (Confirm with patient/family/NH records and if not entered, this has to be entered at [facility] point of entry) Patient coming from: Group home  I have personally briefly reviewed patient's old medical records in [facility]  Chief Complaint: Belly hurts  HPI: Eugene Silva is a 33 y.o. male with medical history significant of traumatic brain injury presented with new onset abdominal pain.  Symptoms started yesterday morning, patient started develop periumbilical abdominal pain associate with nausea but no vomiting.  Denied any diarrhea or constipation.  He said that the abdominal pain has been intermittent, worsening with eating meals, no relieving factors.  ED Course: Borderline tachycardia blood pressure 124/97 nonhypoxic.  CT abdomen pelvis showed metal foreign body in the distal to terminal ileus, appendagitis.  Blood work showed WBCs 12.2, hemoglobin 15.7, creatinine 1.2 and bicarb 20 BUN 18, normal LFTs.  Review of Systems: As per HPI otherwise 14 point review of systems negative.    Past Medical History:  Diagnosis Date   Anxiety    Depression    Traumatic brain injury [facility])     History reviewed. No pertinent surgical history.   reports that he has never smoked. He has never used smokeless tobacco. He reports that he does not drink alcohol and does not use drugs.  No Known Allergies  History reviewed. No pertinent family history.   Prior to Admission medications   Medication Sig Start Date End Date Taking? Authorizing Provider  ibuprofen (ADVIL,MOTRIN) 600 MG tablet Take 1 tablet (600 mg total) by mouth every 6 (six) hours as needed. 11/18/18   Enid Derry, PA-C  neomycin-bacitracin-polymyxin (NEOSPORIN) ointment Apply 1 application topically every 12 (twelve) hours. 11/18/18   Enid Derry, PA-C  ranitidine (ZANTAC) 150 MG tablet  Take 1 tablet (150 mg total) by mouth 2 (two) times daily. 11/26/16 11/26/17  Phineas Semen, MD    Physical Exam: Vitals:   11/28/23 0042 11/28/23 0049 11/28/23 0050 11/28/23 0540  BP:   (!) 124/97 (!) 134/98  Pulse:  (!) 101  77  Resp:  18  16  Temp:  98.1 F (36.7 C)  97.7 F (36.5 C)  TempSrc:  Oral  Oral  SpO2:  96%  97%  Height: 5\' 7"  (1.702 m)       Constitutional: NAD, calm, comfortable Vitals:   11/28/23 0042 11/28/23 0049 11/28/23 0050 11/28/23 0540  BP:   (!) 124/97 (!) 134/98  Pulse:  (!) 101  77  Resp:  18  16  Temp:  98.1 F (36.7 C)  97.7 F (36.5 C)  TempSrc:  Oral  Oral  SpO2:  96%  97%  Height: 5\' 7"  (1.702 m)      Eyes: PERRL, lids and conjunctivae normal ENMT: Mucous membranes are moist. Posterior pharynx clear of any exudate or lesions.Normal dentition.  Neck: normal, supple, no masses, no thyromegaly Respiratory: clear to auscultation bilaterally, no wheezing, no crackles. Normal respiratory effort. No accessory muscle use.  Cardiovascular: Regular rate and rhythm, no murmurs / rubs / gallops. No extremity edema. 2+ pedal pulses. No carotid bruits.  Abdomen: no tenderness, no masses palpated. No hepatosplenomegaly. Bowel sounds positive.  Musculoskeletal: no clubbing / cyanosis. No joint deformity upper and lower extremities. Good ROM, no contractures. Normal muscle tone.  Skin: no rashes, lesions, ulcers. No induration Neurologic: CN 2-12 grossly intact. Sensation intact, DTR normal. Strength 5/5 in all  4.  Psychiatric: Normal judgment and insight. Alert and oriented x 3. Normal mood.     Labs on Admission: I have personally reviewed following labs and imaging studies  CBC: Recent Labs  Lab 11/28/23 0052  WBC 12.2*  HGB 15.7  HCT 48.4  MCV 88.2  PLT 487*   Basic Metabolic Panel: Recent Labs  Lab 11/28/23 0052  NA 139  K 3.6  CL 101  CO2 20*  GLUCOSE 116*  BUN 18  CREATININE 1.23  CALCIUM 10.3   GFR: CrCl cannot be calculated  (Unknown ideal weight.). Liver Function Tests: Recent Labs  Lab 11/28/23 0052  AST 27  ALT 36  ALKPHOS 68  BILITOT 1.0  PROT 8.8*  ALBUMIN 4.5   Recent Labs  Lab 11/28/23 0052  LIPASE 24   No results for input(s): "AMMONIA" in the last 168 hours. Coagulation Profile: No results for input(s): "INR", "PROTIME" in the last 168 hours. Cardiac Enzymes: No results for input(s): "CKTOTAL", "CKMB", "CKMBINDEX", "TROPONINI" in the last 168 hours. BNP (last 3 results) No results for input(s): "PROBNP" in the last 8760 hours. HbA1C: No results for input(s): "HGBA1C" in the last 72 hours. CBG: No results for input(s): "GLUCAP" in the last 168 hours. Lipid Profile: No results for input(s): "CHOL", "HDL", "LDLCALC", "TRIG", "CHOLHDL", "LDLDIRECT" in the last 72 hours. Thyroid Function Tests: No results for input(s): "TSH", "T4TOTAL", "FREET4", "T3FREE", "THYROIDAB" in the last 72 hours. Anemia Panel: No results for input(s): "VITAMINB12", "FOLATE", "FERRITIN", "TIBC", "IRON", "RETICCTPCT" in the last 72 hours. Urine analysis:    Component Value Date/Time   COLORURINE YELLOW (A) 11/28/2023 0756   APPEARANCEUR CLEAR (A) 11/28/2023 0756   LABSPEC >1.046 (H) 11/28/2023 0756   PHURINE 5.0 11/28/2023 0756   GLUCOSEU NEGATIVE 11/28/2023 0756   HGBUR SMALL (A) 11/28/2023 0756   BILIRUBINUR NEGATIVE 11/28/2023 0756   KETONESUR NEGATIVE 11/28/2023 0756   PROTEINUR NEGATIVE 11/28/2023 0756   NITRITE NEGATIVE 11/28/2023 0756   LEUKOCYTESUR NEGATIVE 11/28/2023 0756    Radiological Exams on Admission: CT ABDOMEN PELVIS W CONTRAST Result Date: 11/28/2023 CLINICAL DATA:  Nausea, vomiting and abdominal pain for 2 days. EXAM: CT ABDOMEN AND PELVIS WITH CONTRAST TECHNIQUE: Multidetector CT imaging of the abdomen and pelvis was performed using the standard protocol following bolus administration of intravenous contrast. RADIATION DOSE REDUCTION: This exam was performed according to the departmental  dose-optimization program which includes automated exposure control, adjustment of the mA and/or kV according to patient size and/or use of iterative reconstruction technique. CONTRAST:  OMNIPAQUE IOHEXOL 300 MG/ML  SOLN COMPARISON:  None Available. FINDINGS: Lower chest: No abnormality. Hepatobiliary: Liver is 18.5 cm in length and is moderately steatotic. There is no mass enhancement. Gallbladder and bile ducts are unremarkable. Pancreas: No abnormality. Spleen: No abnormality. Adrenals/Urinary Tract: There is no adrenal mass. There is a 6 mm hypodensity in the superior pole of the left kidney consistent with a Bosniak 2 cyst, too small to characterize. No follow-up imaging is recommended. Both kidneys are otherwise homogeneous. There is a solitary 1 mm caliceal nonobstructing stone in the midpole left kidney. There are no obstructing stones or hydronephrosis. The bladder is unremarkable. Stomach/Bowel: No dilatation or wall thickening. There is a dense smoothly rounded ovoid metallic structure measuring 3 x 1.2 x 3 cm in the terminal ileum about 7-8 cm proximal to the ileocecal junction consistent with an ingested foreign body. There are no inflammatory changes around this portion of the ileum. On 3:46 there is a fat  density ovoid structure medial to the distal ascending colon measuring 2.8 x 1.4 cm, with a hyperattenuating thin rim and trace stranding along the lateral aspect. Findings are consistent with epiploic appendagitis. Does the patient have any right upper quadrant pain? The appendix is normal. There is scattered diverticulosis of the transverse and left-sided colon without evidence of diverticulitis, unremarkable rectum. Vascular/Lymphatic: No significant vascular findings are present. No enlarged abdominal or pelvic lymph nodes. Reproductive: Prostate is unremarkable. Other: Small umbilical and inguinal fat hernias. No incarcerated hernia. Trace ascites in the posterior deep pelvis. Nonspecific  but abnormal in males. There is no free hemorrhage, free air or localizing collection. Musculoskeletal: No acute or significant osseous findings. IMPRESSION: 1. 2.8 x 1.4 cm fat density ovoid structure medial to the distal ascending colon with a hyperattenuating thin rim and trace stranding along the lateral aspect. Findings are consistent with epiploic appendagitis. Does the patient have any right upper quadrant pain? 2. 3 x 1.2 x 3 cm dense smoothly rounded ovoid metallic structure in the terminal ileum about 7-8 cm proximal to the ileocecal junction consistent with an ingested foreign body. There are no inflammatory changes around this portion of the ileum. 3. Moderate hepatic steatosis. 4. 1 mm nonobstructing left renal calyceal stone. 5. Diverticulosis without evidence of diverticulitis. 6. Trace ascites in the posterior deep pelvis. Nonspecific but abnormal in males. 7. Small umbilical and inguinal fat hernias. Electronically Signed   By: Almira Bar M.D.   On: 11/28/2023 07:38    EKG: None  Assessment/Plan Principal Problem:   Foreign body in ileum  (please populate well all problems here in Problem List. (For example, if patient is on BP meds at home and you resume or decide to hold them, it is a problem that needs to be her. Same for CAD, COPD, HLD and so on)  Acute abdominal pain -Secondary to ingested foreign body -Appears to have no signs of bowel obstruction at this point.  But given that eating meals leads to worsening of abdominal pain, general surgeon recommend patient remain n.p.o. for now. -Toradol for pain -As needed Zofran -ED physician also discussed case with GI, who recommend conservative management and the locations of foreign body does not warrantee colonoscopy -As per recommendation of general surgeon, plan to continue conservative management and repeat abdominal x-ray make sure foreign body pass through.  TBI -Mentation at baseline.  DVT prophylaxis: SCD Code  Status: Full code Family Communication: None at bedside Disposition Plan: Expect less than 2 midnight hospital stay Consults called: General surgeon, curbside consult with GI, reconsult if considered necessary Admission status: MedSurg observation   Emeline General MD [facility] Pager [phone]  11/28/2023, 11:28 AM

## 2023-11-28 NOTE — ED Provider Notes (Signed)
[facility] Provider Note    Event Date/Time   First MD Initiated Contact with Patient 11/28/23 [phone]     (approximate)   History   Abdominal Pain   HPI Eugene Silva is a 33 y.o. male with a history of depression and traumatic brain injury.  He presents for evaluation of 2 days of symptoms including lower abdominal pain, nausea, vomiting, and diarrhea.  He said that he usually goes about once a day but he is having 2-3 episodes of soft bowel movements a day.  He said it hurts anytime he eats or drinks anything.  He has not been able to drink even water today.  Nothing in particular makes his symptoms better or worse.  No fever, chest pain, nor shortness of breath.     Physical Exam   Triage Vital Signs: ED Triage Vitals  Encounter Vitals Group     BP 11/28/23 0050 (!) 124/97     Systolic BP Percentile --      Diastolic BP Percentile --      Pulse Rate 11/28/23 0049 (!) 101     Resp 11/28/23 0049 18     Temp 11/28/23 0049 98.1 F (36.7 C)     Temp Source 11/28/23 0049 Oral     SpO2 11/28/23 0049 96 %     Weight --      Height 11/28/23 0042 1.702 m (5\' 7" )     Head Circumference --      Peak Flow --      Pain Score 11/28/23 0100 10     Pain Loc --      Pain Education --      Exclude from Growth Chart --     Most recent vital signs: Vitals:   11/28/23 0050 11/28/23 0540  BP: (!) 124/97 (!) 134/98  Pulse:  77  Resp:  16  Temp:  97.7 F (36.5 C)  SpO2:  97%    General: Awake, alert, appropriately interactive.  Appears ill but nontoxic. CV:  Good peripheral perfusion.  Regular rhythm, borderline tachycardia. Resp:  Normal effort. Speaking easily and comfortably, no accessory muscle usage nor intercostal retractions.   Abd:  No distention.  Mild periumbilical tenderness to palpation.  No rebound or guarding. Other:  Patient is pale, pallor, appears mildly dehydrated   ED Results / Procedures / Treatments   Labs (all labs  ordered are listed, but only abnormal results are displayed) Labs Reviewed  COMPREHENSIVE METABOLIC PANEL - Abnormal; Notable for the following components:      Result Value   CO2 20 (*)    Glucose, Bld 116 (*)    Total Protein 8.8 (*)    Anion gap 18 (*)    All other components within normal limits  CBC - Abnormal; Notable for the following components:   WBC 12.2 (*)    Platelets 487 (*)    All other components within normal limits  RESP PANEL BY RT-PCR (RSV, FLU A&B, COVID)  RVPGX2  LIPASE, BLOOD  URINALYSIS, ROUTINE W REFLEX MICROSCOPIC      RADIOLOGY CT of the abdomen and pelvis: See hospital course for details   PROCEDURES:  Critical Care performed: No  Procedures    IMPRESSION / MDM / ASSESSMENT AND PLAN / ED COURSE  I reviewed the triage vital signs and the nursing notes.  Differential diagnosis includes, but is not limited to, viral illness such as norovirus, appendicitis, diverticulitis.  Patient's presentation is most consistent with acute presentation with potential threat to life or bodily function.  Labs/studies ordered: Respiratory viral panel was ordered because influenza is highly prevalent in the community and can cause GI symptoms as well.  Also ordered CMP, lipase, CBC, CT of the abdomen and pelvis.  Interventions/Medications given:  Medications  ondansetron (ZOFRAN-ODT) disintegrating tablet 4 mg (4 mg Oral Given 11/28/23 0053)  sodium chloride 0.9 % bolus 1,000 mL (1,000 mLs Intravenous New Bag/Given 11/28/23 0538)  ondansetron (ZOFRAN) injection 4 mg (4 mg Intravenous Given 11/28/23 0538)  iohexol (OMNIPAQUE) 300 MG/ML solution 100 mL (100 mLs Intravenous Contrast Given 11/28/23 0554)    (Note:  hospital course my include additional interventions and/or labs/studies not listed above.)   Patient is ill-appearing though nontoxic.  Mild tenderness to palpation but given his symptoms, his traumatic brain injury that may  be contributing to some difficulties providing a detailed history, mild tachycardia, mild leukocytosis, etc., I will proceed with CT of the abdomen pelvis to rule out appendicitis, diverticulitis, or other intra-abdominal infection requiring antibiotics.  He also appears a bit dehydrated even though his labs are generally reassuring (although he does have an elevated anion gap of 18 which I think is likely due to his vomiting and decreased oral intake).  Anticipate discharge and outpatient follow-up if his CT is reassuring.     Clinical Course as of 11/28/23 0731  Sat Nov 28, 2023  0730 Transferring ED care to Dr. Lenard Lance to follow-up on the CT scan results.  Anticipate discharge if results are reassuring. [CF]    Clinical Course User Index [CF] Loleta Rose, MD     FINAL CLINICAL IMPRESSION(S) / ED DIAGNOSES   Final diagnoses:  Periumbilical abdominal pain  Nausea vomiting and diarrhea     Rx / DC Orders   ED Discharge Orders     None        Note:  This document was prepared using Dragon voice recognition software and may include unintentional dictation errors.   Loleta Rose, MD 11/28/23 [phone]

## 2023-11-28 NOTE — Consult Note (Signed)
Patient ID: Eugene Silva, male   DOB: 10/26/1991, 33 y.o.   MRN: [id number] CC: Possible Foreign Body in Terminal Ileum History of Present Illness Eugene Silva is a 33 y.o. male with past medical history significant for a TBI who lives in a group home who is seen in consultation for abdominal pain.  The patient reports that he developed abdominal pain yesterday.  He says that it was throughout his abdomen and it was worse when he ate.  He also says that he got nauseated when he ate.  He reports that he is continue to pass flatus and have bowel movements.  He denies ingesting any foreign body  Past Medical History Past Medical History:  Diagnosis Date   Anxiety    Depression    Traumatic brain injury [facility])        History reviewed. No pertinent surgical history.  No Known Allergies  Current Facility-Administered Medications  Medication Dose Route Frequency Provider Last Rate Last Admin   0.9 %  sodium chloride infusion   Intravenous Continuous Emeline General, MD 125 mL/hr at 11/28/23 1139 New Bag at 11/28/23 1139   acetaminophen (TYLENOL) tablet 650 mg  650 mg Oral Q6H PRN Emeline General, MD       Or   acetaminophen (TYLENOL) suppository 650 mg  650 mg Rectal Q6H PRN Mikey College T, MD       famotidine (PEPCID) tablet 20 mg  20 mg Oral Daily Mikey College T, MD   20 mg at 11/28/23 1139   ketorolac (TORADOL) 30 MG/ML injection 30 mg  30 mg Intravenous Q6H PRN Mikey College T, MD       ondansetron [facility]) injection 4 mg  4 mg Intravenous Q6H PRN Mikey College T, MD       polyethylene glycol (MIRALAX / GLYCOLAX) packet 17 g  17 g Oral Daily PRN Emeline General, MD       Current Outpatient Medications  Medication Sig Dispense Refill   atorvastatin (LIPITOR) 20 MG tablet Take 20 mg by mouth daily.     buPROPion (WELLBUTRIN XL) 300 MG 24 hr tablet Take 300 mg by mouth daily.     famotidine (PEPCID) 40 MG tablet Take 40 mg by mouth daily.     fenofibrate (TRICOR) 145 MG tablet Take 145  mg by mouth daily.     INGREZZA 40 MG capsule Take 40 mg by mouth daily.     trimethoprim-polymyxin b (POLYTRIM) ophthalmic solution Place 1 drop into both eyes every 6 (six) hours.     divalproex (DEPAKOTE) 250 MG DR tablet Take 250 mg by mouth 3 (three) times daily.     FLUoxetine (PROZAC) 40 MG capsule Take 40 mg by mouth daily.     hydrOXYzine (ATARAX) 25 MG tablet Take 25 mg by mouth daily.     ibuprofen (ADVIL,MOTRIN) 600 MG tablet Take 1 tablet (600 mg total) by mouth every 6 (six) hours as needed. 30 tablet 0   neomycin-bacitracin-polymyxin (NEOSPORIN) ointment Apply 1 application topically every 12 (twelve) hours. 15 g 0   ranitidine (ZANTAC) 150 MG tablet Take 1 tablet (150 mg total) by mouth 2 (two) times daily. 60 tablet 1    Family History History reviewed. No pertinent family history.     Social History Social History   Tobacco Use   Smoking status: Never   Smokeless tobacco: Never  Substance Use Topics   Alcohol use: No   Drug use: Never  ROS Full ROS of systems performed and is otherwise negative there than what is stated in the HPI  Physical Exam Blood pressure 120/86, pulse 78, temperature 98.1 F (36.7 C), temperature source Oral, resp. rate 16, height 5\' 7"  (1.702 m), SpO2 99%.  Alert and oriented x 3, normal work of breathing on room air, no acute distress, regular rate and rhythm, abdomen is soft, nontender and nondistended.  No surgical scars.  Moving all extremities spontaneously. Data Reviewed I reviewed his CT scan.  There is some inflammation in the right hemiabdomen concerning for appendagitis.  He also has an ovoid contrast-enhancing structure in the terminal ileum that is concerning for a foreign body.  His labs are notable for a slight leukocytosis to 12,0002  I have personally reviewed the patient's imaging and medical records.    Assessment    Patient with 1 day history of abdominal pain worse with eating associated with nausea and  vomiting.  CT scan concerning for ingestion of a foreign body.  He is doing well on exam with a benign abdomen.  He also has appendagitis on his CT scan.  At this time recommend as needed pain control particularly with NSAIDs for his appendagitis.  I am also concerned about this foreign body if it possibly obstructs at the ileocecal valve.  For now I would keep him n.p.o. and we will get a repeat x-ray in the morning to see if there is any evidence of this passing into the colon.    Eugene Silva 11/28/2023, 3:04 PM

## 2023-11-29 ENCOUNTER — Observation Stay: Payer: Medicare HMO

## 2023-11-29 DIAGNOSIS — R1033 Periumbilical pain: Secondary | ICD-10-CM | POA: Diagnosis not present

## 2023-11-29 DIAGNOSIS — T183XXA Foreign body in small intestine, initial encounter: Secondary | ICD-10-CM | POA: Diagnosis not present

## 2023-11-29 LAB — GLUCOSE, CAPILLARY: Glucose-Capillary: 97 mg/dL (ref 70–99)

## 2023-11-29 NOTE — Progress Notes (Signed)
CSW met with patient at bedside to deliver patients MOON. Patient states he has a legal guardian, Eugene Silva [phone] with [facility]. Patient stated he would like CSW to contact his guardian because he is unsure if he is supposed to sign anything with their approval. Patient stated he didn't want to get in trouble. CSW contacted patients guardian and the voicemail directed CSW to contact the after hours number [phone] for assistance. CSW left a message with intake requesting a call back

## 2023-11-29 NOTE — Progress Notes (Signed)
PROGRESS NOTE    Eugene Silva  XLK:[id number] DOB: 02/23/1991 DOA: 11/28/2023 PCP: [facility]    Brief Narrative:  33 y.o. male with medical history significant of traumatic brain injury presented with new onset abdominal pain.   Symptoms started yesterday morning, patient started develop periumbilical abdominal pain associate with nausea but no vomiting.  Denied any diarrhea or constipation.  He said that the abdominal pain has been intermittent, worsening with eating meals, no relieving factors.  Patient reports feeling improved on hospital day #2.  No nausea or vomiting.  Abdominal pain subsided.  Seen in consultation by general surgery.  Diet cautiously advance to clear liquids.   Assessment & Plan:   Principal Problem:   Foreign body in ileum Active Problems:   Periumbilical abdominal pain   Acute abdominal pain Likely secondary to ingested foreign body.  No clinical evidence of obstruction.  Cautiously advance diet to clear liquids.  N.p.o. after midnight and repeat KUB 5 AM 1/27.  Surgery following.  TBI No acute issues.  Mentation at baseline.   DVT prophylaxis: SCD Code Status: Full Family Communication: None Disposition Plan: Status is: Observation The patient will require care spanning > 2 midnights and should be moved to inpatient because: Concern for foreign body ingestion causing obstruction like picture.   Level of care: Med-Surg  Consultants:  General surgery  Procedures:  None  Antimicrobials: None   Subjective: Seen and examined.  Resting in bed.  Reports no abdominal pain.  Objective: Vitals:   11/29/23 0500 11/29/23 0504 11/29/23 1000 11/29/23 1102  BP:  114/86 121/72   Pulse:  75 81   Resp:  18 18   Temp: 97.9 F (36.6 C)   98.7 F (37.1 C)  TempSrc: Oral     SpO2:  98% 98%   Height:       No intake or output data in the 24 hours ending 11/29/23 1241 There were no vitals filed for this  visit.  Examination:  General exam: Appears calm and comfortable  Respiratory system: Clear to auscultation. Respiratory effort normal. Cardiovascular system: S1-S2, RRR, no murmurs, no pedal edema Gastrointestinal system: Soft, NT/ND, normal bowel sounds Central nervous system: Alert and oriented. No focal neurological deficits. Extremities: Symmetric 5 x 5 power. Skin: No rashes, lesions or ulcers Psychiatry: Judgement and insight appear impaired. Mood & affect appropriate.     Data Reviewed: I have personally reviewed following labs and imaging studies  CBC: Recent Labs  Lab 11/28/23 0052  WBC 12.2*  HGB 15.7  HCT 48.4  MCV 88.2  PLT 487*   Basic Metabolic Panel: Recent Labs  Lab 11/28/23 0052  NA 139  K 3.6  CL 101  CO2 20*  GLUCOSE 116*  BUN 18  CREATININE 1.23  CALCIUM 10.3   GFR: CrCl cannot be calculated (Unknown ideal weight.). Liver Function Tests: Recent Labs  Lab 11/28/23 0052  AST 27  ALT 36  ALKPHOS 68  BILITOT 1.0  PROT 8.8*  ALBUMIN 4.5   Recent Labs  Lab 11/28/23 0052  LIPASE 24   No results for input(s): "AMMONIA" in the last 168 hours. Coagulation Profile: No results for input(s): "INR", "PROTIME" in the last 168 hours. Cardiac Enzymes: No results for input(s): "CKTOTAL", "CKMB", "CKMBINDEX", "TROPONINI" in the last 168 hours. BNP (last 3 results) No results for input(s): "PROBNP" in the last 8760 hours. HbA1C: No results for input(s): "HGBA1C" in the last 72 hours. CBG: No results for input(s): "GLUCAP" in  the last 168 hours. Lipid Profile: No results for input(s): "CHOL", "HDL", "LDLCALC", "TRIG", "CHOLHDL", "LDLDIRECT" in the last 72 hours. Thyroid Function Tests: No results for input(s): "TSH", "T4TOTAL", "FREET4", "T3FREE", "THYROIDAB" in the last 72 hours. Anemia Panel: No results for input(s): "VITAMINB12", "FOLATE", "FERRITIN", "TIBC", "IRON", "RETICCTPCT" in the last 72 hours. Sepsis Labs: No results for input(s):  "PROCALCITON", "LATICACIDVEN" in the last 168 hours.  Recent Results (from the past 240 hours)  Resp panel by RT-PCR (RSV, Flu A&B, Covid) Anterior Nasal Swab     Status: None   Collection Time: 11/28/23 12:52 AM   Specimen: Anterior Nasal Swab  Result Value Ref Range Status   SARS Coronavirus 2 by RT PCR NEGATIVE NEGATIVE Final    Comment: (NOTE) SARS-CoV-2 target nucleic acids are NOT DETECTED.  The SARS-CoV-2 RNA is generally detectable in upper respiratory specimens during the acute phase of infection. The lowest concentration of SARS-CoV-2 viral copies this assay can detect is 138 copies/mL. A negative result does not preclude SARS-Cov-2 infection and should not be used as the sole basis for treatment or other patient management decisions. A negative result may occur with  improper specimen collection/handling, submission of specimen other than nasopharyngeal swab, presence of viral mutation(s) within the areas targeted by this assay, and inadequate number of viral copies(<138 copies/mL). A negative result must be combined with clinical observations, patient history, and epidemiological information. The expected result is Negative.  Fact Sheet for Patients:  BloggerCourse.com  Fact Sheet for Healthcare Providers:  SeriousBroker.it  This test is no t yet approved or cleared by the [place] FDA and  has been authorized for detection and/or diagnosis of SARS-CoV-2 by FDA under an Emergency Use Authorization (EUA). This EUA will remain  in effect (meaning this test can be used) for the duration of the COVID-19 declaration under Section 564(b)(1) of the Act, 21 U.S.C.section 360bbb-3(b)(1), unless the authorization is terminated  or revoked sooner.       Influenza A by PCR NEGATIVE NEGATIVE Final   Influenza B by PCR NEGATIVE NEGATIVE Final    Comment: (NOTE) The Xpert Xpress SARS-CoV-2/FLU/RSV plus assay is intended as  an aid in the diagnosis of influenza from Nasopharyngeal swab specimens and should not be used as a sole basis for treatment. Nasal washings and aspirates are unacceptable for Xpert Xpress SARS-CoV-2/FLU/RSV testing.  Fact Sheet for Patients: BloggerCourse.com  Fact Sheet for Healthcare Providers: SeriousBroker.it  This test is not yet approved or cleared by the [place] FDA and has been authorized for detection and/or diagnosis of SARS-CoV-2 by FDA under an Emergency Use Authorization (EUA). This EUA will remain in effect (meaning this test can be used) for the duration of the COVID-19 declaration under Section 564(b)(1) of the Act, 21 U.S.C. section 360bbb-3(b)(1), unless the authorization is terminated or revoked.     Resp Syncytial Virus by PCR NEGATIVE NEGATIVE Final    Comment: (NOTE) Fact Sheet for Patients: BloggerCourse.com  Fact Sheet for Healthcare Providers: SeriousBroker.it  This test is not yet approved or cleared by the [place] FDA and has been authorized for detection and/or diagnosis of SARS-CoV-2 by FDA under an Emergency Use Authorization (EUA). This EUA will remain in effect (meaning this test can be used) for the duration of the COVID-19 declaration under Section 564(b)(1) of the Act, 21 U.S.C. section 360bbb-3(b)(1), unless the authorization is terminated or revoked.  Performed at [facility], [address], [place], [place] [address]  Radiology Studies: DG Abd 1 View Result Date: 11/29/2023 CLINICAL DATA:  33 year old male with evidence of retained foreign body in the distal small bowel on CT Abdomen and Pelvis this morning. EXAM: ABDOMEN - 1 VIEW COMPARISON:  CT Abdomen and Pelvis 0600 hours today. FINDINGS: Supine AP views at 0706 hours. Smooth and oval 3.3 cm calcified or less likely low-density metallic object is  unchanged in the right lower quadrant. Nonobstructed bowel-gas pattern. Stable visualized osseous structures. Negative visible lung bases. IMPRESSION: Unchanged right lower quadrant 3.3 cm oval foreign body within the distal small bowel, more likely calcified than metallic. Nonobstructed bowel-gas pattern. Electronically Signed   By: Odessa Fleming M.D.   On: 11/29/2023 07:55   CT ABDOMEN PELVIS W CONTRAST Result Date: 11/28/2023 CLINICAL DATA:  Nausea, vomiting and abdominal pain for 2 days. EXAM: CT ABDOMEN AND PELVIS WITH CONTRAST TECHNIQUE: Multidetector CT imaging of the abdomen and pelvis was performed using the standard protocol following bolus administration of intravenous contrast. RADIATION DOSE REDUCTION: This exam was performed according to the departmental dose-optimization program which includes automated exposure control, adjustment of the mA and/or kV according to patient size and/or use of iterative reconstruction technique. CONTRAST:  OMNIPAQUE IOHEXOL 300 MG/ML  SOLN COMPARISON:  None Available. FINDINGS: Lower chest: No abnormality. Hepatobiliary: Liver is 18.5 cm in length and is moderately steatotic. There is no mass enhancement. Gallbladder and bile ducts are unremarkable. Pancreas: No abnormality. Spleen: No abnormality. Adrenals/Urinary Tract: There is no adrenal mass. There is a 6 mm hypodensity in the superior pole of the left kidney consistent with a Bosniak 2 cyst, too small to characterize. No follow-up imaging is recommended. Both kidneys are otherwise homogeneous. There is a solitary 1 mm caliceal nonobstructing stone in the midpole left kidney. There are no obstructing stones or hydronephrosis. The bladder is unremarkable. Stomach/Bowel: No dilatation or wall thickening. There is a dense smoothly rounded ovoid metallic structure measuring 3 x 1.2 x 3 cm in the terminal ileum about 7-8 cm proximal to the ileocecal junction consistent with an ingested foreign body. There are no  inflammatory changes around this portion of the ileum. On 3:46 there is a fat density ovoid structure medial to the distal ascending colon measuring 2.8 x 1.4 cm, with a hyperattenuating thin rim and trace stranding along the lateral aspect. Findings are consistent with epiploic appendagitis. Does the patient have any right upper quadrant pain? The appendix is normal. There is scattered diverticulosis of the transverse and left-sided colon without evidence of diverticulitis, unremarkable rectum. Vascular/Lymphatic: No significant vascular findings are present. No enlarged abdominal or pelvic lymph nodes. Reproductive: Prostate is unremarkable. Other: Small umbilical and inguinal fat hernias. No incarcerated hernia. Trace ascites in the posterior deep pelvis. Nonspecific but abnormal in males. There is no free hemorrhage, free air or localizing collection. Musculoskeletal: No acute or significant osseous findings. IMPRESSION: 1. 2.8 x 1.4 cm fat density ovoid structure medial to the distal ascending colon with a hyperattenuating thin rim and trace stranding along the lateral aspect. Findings are consistent with epiploic appendagitis. Does the patient have any right upper quadrant pain? 2. 3 x 1.2 x 3 cm dense smoothly rounded ovoid metallic structure in the terminal ileum about 7-8 cm proximal to the ileocecal junction consistent with an ingested foreign body. There are no inflammatory changes around this portion of the ileum. 3. Moderate hepatic steatosis. 4. 1 mm nonobstructing left renal calyceal stone. 5. Diverticulosis without evidence of diverticulitis. 6. Trace ascites in the  posterior deep pelvis. Nonspecific but abnormal in males. 7. Small umbilical and inguinal fat hernias. Electronically Signed   By: Almira Bar M.D.   On: 11/28/2023 07:38        Scheduled Meds:  famotidine  20 mg Oral Daily   Continuous Infusions:   LOS: 0 days    Tresa Moore, MD [facility]   If  7PM-7AM, please contact night-coverage  11/29/2023, 12:41 PM

## 2023-11-29 NOTE — Progress Notes (Signed)
CSW received a call back from [facility] DSS. CSW was told patient cannot sign the MOON and to mail the document to patients guardian Lenor Derrick. CSW was given the address of [address] [place], [place] [address]. CSW was told to make sure it states ATTENTION: Lenor Derrick.

## 2023-11-29 NOTE — Progress Notes (Signed)
CC: Possible foreign body ingestion, abdominal pain Subjective: Patient reports doing fine today.  He has not had any nausea or vomiting.  He says his abdominal pain has subsided.  No fevers or chills  Objective: Vital signs in last 24 hours: Temp:  [97.9 F (36.6 C)-98.7 F (37.1 C)] 98.7 F (37.1 C) (01/26 1102) Pulse Rate:  [72-86] 81 (01/26 1000) Resp:  [16-22] 18 (01/26 1000) BP: (112-121)/(68-86) 121/72 (01/26 1000) SpO2:  [95 %-100 %] 98 % (01/26 1000)    Intake/Output from previous day: 01/25 0701 - 01/26 0700 In: 999 [IV Piggyback:999] Out: -  Intake/Output this shift: No intake/output data recorded.  Physical exam:  Abdomen soft, nontender and nondistended.  Lab Results: CBC  Recent Labs    11/28/23 0052  WBC 12.2*  HGB 15.7  HCT 48.4  PLT 487*   BMET Recent Labs    11/28/23 0052  NA 139  K 3.6  CL 101  CO2 20*  GLUCOSE 116*  BUN 18  CREATININE 1.23  CALCIUM 10.3   PT/INR No results for input(s): "LABPROT", "INR" in the last 72 hours. ABG No results for input(s): "PHART", "HCO3" in the last 72 hours.  Invalid input(s): "PCO2", "PO2"  Studies/Results: DG Abd 1 View Result Date: 11/29/2023 CLINICAL DATA:  33 year old male with evidence of retained foreign body in the distal small bowel on CT Abdomen and Pelvis this morning. EXAM: ABDOMEN - 1 VIEW COMPARISON:  CT Abdomen and Pelvis 0600 hours today. FINDINGS: Supine AP views at 0706 hours. Smooth and oval 3.3 cm calcified or less likely low-density metallic object is unchanged in the right lower quadrant. Nonobstructed bowel-gas pattern. Stable visualized osseous structures. Negative visible lung bases. IMPRESSION: Unchanged right lower quadrant 3.3 cm oval foreign body within the distal small bowel, more likely calcified than metallic. Nonobstructed bowel-gas pattern. Electronically Signed   By: Odessa Fleming M.D.   On: 11/29/2023 07:55   CT ABDOMEN PELVIS W CONTRAST Result Date: 11/28/2023 CLINICAL  DATA:  Nausea, vomiting and abdominal pain for 2 days. EXAM: CT ABDOMEN AND PELVIS WITH CONTRAST TECHNIQUE: Multidetector CT imaging of the abdomen and pelvis was performed using the standard protocol following bolus administration of intravenous contrast. RADIATION DOSE REDUCTION: This exam was performed according to the departmental dose-optimization program which includes automated exposure control, adjustment of the mA and/or kV according to patient size and/or use of iterative reconstruction technique. CONTRAST:  OMNIPAQUE IOHEXOL 300 MG/ML  SOLN COMPARISON:  None Available. FINDINGS: Lower chest: No abnormality. Hepatobiliary: Liver is 18.5 cm in length and is moderately steatotic. There is no mass enhancement. Gallbladder and bile ducts are unremarkable. Pancreas: No abnormality. Spleen: No abnormality. Adrenals/Urinary Tract: There is no adrenal mass. There is a 6 mm hypodensity in the superior pole of the left kidney consistent with a Bosniak 2 cyst, too small to characterize. No follow-up imaging is recommended. Both kidneys are otherwise homogeneous. There is a solitary 1 mm caliceal nonobstructing stone in the midpole left kidney. There are no obstructing stones or hydronephrosis. The bladder is unremarkable. Stomach/Bowel: No dilatation or wall thickening. There is a dense smoothly rounded ovoid metallic structure measuring 3 x 1.2 x 3 cm in the terminal ileum about 7-8 cm proximal to the ileocecal junction consistent with an ingested foreign body. There are no inflammatory changes around this portion of the ileum. On 3:46 there is a fat density ovoid structure medial to the distal ascending colon measuring 2.8 x 1.4 cm, with a hyperattenuating thin  rim and trace stranding along the lateral aspect. Findings are consistent with epiploic appendagitis. Does the patient have any right upper quadrant pain? The appendix is normal. There is scattered diverticulosis of the transverse and left-sided colon  without evidence of diverticulitis, unremarkable rectum. Vascular/Lymphatic: No significant vascular findings are present. No enlarged abdominal or pelvic lymph nodes. Reproductive: Prostate is unremarkable. Other: Small umbilical and inguinal fat hernias. No incarcerated hernia. Trace ascites in the posterior deep pelvis. Nonspecific but abnormal in males. There is no free hemorrhage, free air or localizing collection. Musculoskeletal: No acute or significant osseous findings. IMPRESSION: 1. 2.8 x 1.4 cm fat density ovoid structure medial to the distal ascending colon with a hyperattenuating thin rim and trace stranding along the lateral aspect. Findings are consistent with epiploic appendagitis. Does the patient have any right upper quadrant pain? 2. 3 x 1.2 x 3 cm dense smoothly rounded ovoid metallic structure in the terminal ileum about 7-8 cm proximal to the ileocecal junction consistent with an ingested foreign body. There are no inflammatory changes around this portion of the ileum. 3. Moderate hepatic steatosis. 4. 1 mm nonobstructing left renal calyceal stone. 5. Diverticulosis without evidence of diverticulitis. 6. Trace ascites in the posterior deep pelvis. Nonspecific but abnormal in males. 7. Small umbilical and inguinal fat hernias. Electronically Signed   By: Almira Bar M.D.   On: 11/28/2023 07:38    Anti-infectives: Anti-infectives (From admission, onward)    None       Assessment/Plan:  44-year-old male with history of TBI who presented with abdominal pain.  CT scan showed that there was a possible foreign body in the right lower quadrant just adjacent to the ileocecal valve.  He was kept n.p.o. yesterday and has not had any further episodes of nausea or vomiting.  His KUB this morning shows continued radiopaque object in the right lower quadrant.  The patient denies ingesting any foreign object although again he has a TBI so not the most reliable historian.  On the KUB it looks  like it possibly could be in the colon versus the distal small bowel.  Nonetheless he has no signs of clinical obstruction.  We will allow him for a clear liquid diet today.  Please keep him n.p.o. and we will get a repeat x-ray in the morning to see if there is any progression of the foreign body through the GI tract  Baker Pierini, M.D. [facility]

## 2023-11-30 ENCOUNTER — Observation Stay: Payer: Medicare HMO

## 2023-11-30 DIAGNOSIS — T183XXA Foreign body in small intestine, initial encounter: Secondary | ICD-10-CM | POA: Diagnosis not present

## 2023-11-30 MED ORDER — POLYETHYLENE GLYCOL 3350 17 G PO PACK: 17.0000 g | PACK | Freq: Every day | ORAL | 0 refills | Status: AC | PRN

## 2023-11-30 MED ORDER — ONDANSETRON HCL 4 MG PO TABS: 4.0000 mg | ORAL_TABLET | Freq: Every day | ORAL | 0 refills | Status: AC | PRN

## 2023-11-30 NOTE — TOC Progression Note (Signed)
Transition of Care [facility]) - Progression Note    Patient Details  Name: Eugene Silva MRN: [id number] Date of Birth: [birth year]  [facility] CM/SW Contact  Chapman Fitch, RN Phone Number: 11/30/2023, 9:37 AM  Clinical Narrative:     Patient resides at [facility] Spoke with Renee at [facility] who confirms that patient will require Fl2 at discharge   Luster Landsberg can be reached at [phone] or her cell number listed on file.  She request that if patient discharges to place DC summary and Fl2 in discharge packet  She confirms they will be able to provide discharge transportation  Legal Guardian will need to be notified once confirmed patient to discharge       Expected Discharge Plan and Services                                               Social Determinants of Health (SDOH) Interventions SDOH Screenings   Food Insecurity: No Food Insecurity (11/29/2023)  Housing: Low Risk  (11/29/2023)  Transportation Needs: No Transportation Needs (11/29/2023)  Utilities: Not At Risk (11/29/2023)  Tobacco Use: Low Risk  (11/28/2023)    Readmission Risk Interventions     No data to display

## 2023-11-30 NOTE — Discharge Summary (Signed)
Physician Discharge Summary  Kamonte Mcmichen ZOX:[id number] DOB: 27-Mar-1991 DOA: 11/28/2023  PCP: [facility]  Admit date: 11/28/2023 Discharge date: 11/30/2023  Admitted From: Home Disposition:  Home  Recommendations for Outpatient Follow-up:  Follow up with PCP in 1-2 weeks Follow up with general surgery 2/11 at 10AM  Home Health:No  Equipment/Devices:None   Discharge Condition:Stable  CODE STATUS:FULL  Diet recommendation: Reg  Brief/Interim Summary:  33 y.o. male with medical history significant of traumatic brain injury presented with new onset abdominal pain.   Symptoms started yesterday morning, patient started develop periumbilical abdominal pain associate with nausea but no vomiting.  Denied any diarrhea or constipation.  He said that the abdominal pain has been intermittent, worsening with eating meals, no relieving factors.   Patient reports feeling improved on hospital day #2.  No nausea or vomiting.  Abdominal pain subsided.  Seen in consultation by general surgery.  Diet cautiously advance to clear liquids.  Hospital day #3.  Patient stable for discharge.  Seen by general surgery.  Tolerating soft diet without issue.  Patient cleared for discharge and will follow-up with general surgery office on 2/11 at 10 AM.  Will need KUB prior.  Outpatient imaging orders placed by general surgery.    Discharge Diagnoses:  Principal Problem:   Foreign body in ileum Active Problems:   Periumbilical abdominal pain   Acute abdominal pain Likely secondary to ingested foreign body.  No clinical evidence of obstruction.  Patient having bowel movements, passing flatus, having oral intake without nausea vomiting or abdominal pain.  Stable for discharge home.  Prescribed as needed bowel regimen as well as antiemetics on DC.  Follow-up outpatient PCP 1 to 2 weeks.  Follow-up outpatient general surgery 2/11 at 10 AM.  Will need KUB prior.    TBI No acute issues.   Mentation at baseline.  Discharge Instructions  Discharge Instructions     Diet - low sodium heart healthy   Complete by: As directed    Increase activity slowly   Complete by: As directed       Allergies as of 11/30/2023   No Known Allergies      Medication List     STOP taking these medications    ranitidine 150 MG tablet Commonly known as: ZANTAC       TAKE these medications    atorvastatin 20 MG tablet Commonly known as: LIPITOR Take 20 mg by mouth daily.   buPROPion 300 MG 24 hr tablet Commonly known as: WELLBUTRIN XL Take 300 mg by mouth daily.   divalproex 250 MG DR tablet Commonly known as: DEPAKOTE Take 250 mg by mouth 3 (three) times daily.   famotidine 40 MG tablet Commonly known as: PEPCID Take 40 mg by mouth daily.   fenofibrate 145 MG tablet Commonly known as: TRICOR Take 145 mg by mouth daily.   FLUoxetine 40 MG capsule Commonly known as: PROZAC Take 40 mg by mouth daily.   hydrOXYzine 25 MG tablet Commonly known as: ATARAX Take 25 mg by mouth daily.   ibuprofen 600 MG tablet Commonly known as: ADVIL Take 1 tablet (600 mg total) by mouth every 6 (six) hours as needed.   Ingrezza 40 MG capsule Generic drug: valbenazine Take 40 mg by mouth daily.   neomycin-bacitracin-polymyxin [id number] ointment Commonly known as: NEOSPORIN Apply 1 application topically every 12 (twelve) hours.   ondansetron 4 MG tablet Commonly known as: Zofran Take 1 tablet (4 mg total) by mouth daily as needed  for nausea or vomiting.   polyethylene glycol 17 g packet Commonly known as: MiraLax Take 17 g by mouth daily as needed for mild constipation or moderate constipation.   trimethoprim-polymyxin b ophthalmic solution Commonly known as: POLYTRIM Place 1 drop into both eyes every 6 (six) hours.        Follow-up Information     [name], MD. Schedule an appointment as soon as possible for a visit on 12/15/2023.   Specialty: General  Surgery Why: Go to appointment on 02/11 at 10 AM - NEEDS KUB PRIOR Contact information: [address] [place] [address] [phone]         [facility] Follow up.   Specialty: General Practice Why: Hospital follow up Contact information: [address] [place] [address] [phone]                No Known Allergies  Consultations: General surgery   Procedures/Studies: DG Abd 1 View Result Date: 11/30/2023 CLINICAL DATA:  [id number]. Foreign body in the alimentary tract. Follow-up. EXAM: ABDOMEN - 1 VIEW COMPARISON:  Flat plate abdomen study yesterday at 7:06 a.m. FINDINGS: 5:36 a.m. 3.1 cm ovoid opaque foreign body in the right lower quadrant remains in the plane of either the terminal ileum or cecum. It is slightly more cephalad than on yesterday's exam and is now above the level of the iliac crests, was previously superimposed over the right iliac crest. No upstream bowel dilatation is seen. There is no supine evidence of free air or other significant radiographic findings. No nephrolithiasis is seen. IMPRESSION: 3.1 cm ovoid opaque foreign body in the right lower quadrant remains in the plane of either the terminal ileum or cecum. It is slightly more cephalad than on yesterday's exam and is now above the level of the iliac crests, was previously superimposed over the right iliac crest. No upstream bowel dilatation is seen. Electronically Signed   By: Almira Bar M.D.   On: 11/30/2023 06:17   DG Abd 1 View Result Date: 11/29/2023 CLINICAL DATA:  33 year old male with evidence of retained foreign body in the distal small bowel on CT Abdomen and Pelvis this morning. EXAM: ABDOMEN - 1 VIEW COMPARISON:  CT Abdomen and Pelvis 0600 hours today. FINDINGS: Supine AP views at 0706 hours. Smooth and oval 3.3 cm calcified or less likely low-density metallic object is unchanged in the right lower quadrant. Nonobstructed bowel-gas pattern. Stable  visualized osseous structures. Negative visible lung bases. IMPRESSION: Unchanged right lower quadrant 3.3 cm oval foreign body within the distal small bowel, more likely calcified than metallic. Nonobstructed bowel-gas pattern. Electronically Signed   By: Odessa Fleming M.D.   On: 11/29/2023 07:55   CT ABDOMEN PELVIS W CONTRAST Result Date: 11/28/2023 CLINICAL DATA:  Nausea, vomiting and abdominal pain for 2 days. EXAM: CT ABDOMEN AND PELVIS WITH CONTRAST TECHNIQUE: Multidetector CT imaging of the abdomen and pelvis was performed using the standard protocol following bolus administration of intravenous contrast. RADIATION DOSE REDUCTION: This exam was performed according to the departmental dose-optimization program which includes automated exposure control, adjustment of the mA and/or kV according to patient size and/or use of iterative reconstruction technique. CONTRAST:  OMNIPAQUE IOHEXOL 300 MG/ML  SOLN COMPARISON:  None Available. FINDINGS: Lower chest: No abnormality. Hepatobiliary: Liver is 18.5 cm in length and is moderately steatotic. There is no mass enhancement. Gallbladder and bile ducts are unremarkable. Pancreas: No abnormality. Spleen: No abnormality. Adrenals/Urinary Tract: There is no adrenal mass.  There is a 6 mm hypodensity in the superior pole of the left kidney consistent with a Bosniak 2 cyst, too small to characterize. No follow-up imaging is recommended. Both kidneys are otherwise homogeneous. There is a solitary 1 mm caliceal nonobstructing stone in the midpole left kidney. There are no obstructing stones or hydronephrosis. The bladder is unremarkable. Stomach/Bowel: No dilatation or wall thickening. There is a dense smoothly rounded ovoid metallic structure measuring 3 x 1.2 x 3 cm in the terminal ileum about 7-8 cm proximal to the ileocecal junction consistent with an ingested foreign body. There are no inflammatory changes around this portion of the ileum. On 3:46 there is a fat  density ovoid structure medial to the distal ascending colon measuring 2.8 x 1.4 cm, with a hyperattenuating thin rim and trace stranding along the lateral aspect. Findings are consistent with epiploic appendagitis. Does the patient have any right upper quadrant pain? The appendix is normal. There is scattered diverticulosis of the transverse and left-sided colon without evidence of diverticulitis, unremarkable rectum. Vascular/Lymphatic: No significant vascular findings are present. No enlarged abdominal or pelvic lymph nodes. Reproductive: Prostate is unremarkable. Other: Small umbilical and inguinal fat hernias. No incarcerated hernia. Trace ascites in the posterior deep pelvis. Nonspecific but abnormal in males. There is no free hemorrhage, free air or localizing collection. Musculoskeletal: No acute or significant osseous findings. IMPRESSION: 1. 2.8 x 1.4 cm fat density ovoid structure medial to the distal ascending colon with a hyperattenuating thin rim and trace stranding along the lateral aspect. Findings are consistent with epiploic appendagitis. Does the patient have any right upper quadrant pain? 2. 3 x 1.2 x 3 cm dense smoothly rounded ovoid metallic structure in the terminal ileum about 7-8 cm proximal to the ileocecal junction consistent with an ingested foreign body. There are no inflammatory changes around this portion of the ileum. 3. Moderate hepatic steatosis. 4. 1 mm nonobstructing left renal calyceal stone. 5. Diverticulosis without evidence of diverticulitis. 6. Trace ascites in the posterior deep pelvis. Nonspecific but abnormal in males. 7. Small umbilical and inguinal fat hernias. Electronically Signed   By: Almira Bar M.D.   On: 11/28/2023 07:38      Subjective: Seen and examined on the day of discharge.  Stable no distress.  Appropriate for discharge home.  Discharge Exam: Vitals:   11/30/23 0506 11/30/23 0757  BP: 106/78 121/88  Pulse: 73 (!) 57  Resp:  18  Temp: 97.9  F (36.6 C) 97.7 F (36.5 C)  SpO2: 97% 99%   Vitals:   11/29/23 1603 11/29/23 1942 11/30/23 0506 11/30/23 0757  BP: 119/76 120/80 106/78 121/88  Pulse: 68 71 73 (!) 57  Resp: 17 18  18   Temp: 98 F (36.7 C) 97.8 F (36.6 C) 97.9 F (36.6 C) 97.7 F (36.5 C)  TempSrc: Oral Oral Oral   SpO2: 96% 99% 97% 99%  Weight:      Height:        General: Pt is alert, awake, not in acute distress Cardiovascular: RRR, S1/S2 +, no rubs, no gallops Respiratory: CTA bilaterally, no wheezing, no rhonchi Abdominal: Soft, NT, ND, bowel sounds + Extremities: no edema, no cyanosis    The results of significant diagnostics from this hospitalization (including imaging, microbiology, ancillary and laboratory) are listed below for reference.     Microbiology: Recent Results (from the past 240 hours)  Resp panel by RT-PCR (RSV, Flu A&B, Covid) Anterior Nasal Swab     Status: None  Collection Time: 11/28/23 12:52 AM   Specimen: Anterior Nasal Swab  Result Value Ref Range Status   SARS Coronavirus 2 by RT PCR NEGATIVE NEGATIVE Final    Comment: (NOTE) SARS-CoV-2 target nucleic acids are NOT DETECTED.  The SARS-CoV-2 RNA is generally detectable in upper respiratory specimens during the acute phase of infection. The lowest concentration of SARS-CoV-2 viral copies this assay can detect is 138 copies/mL. A negative result does not preclude SARS-Cov-2 infection and should not be used as the sole basis for treatment or other patient management decisions. A negative result may occur with  improper specimen collection/handling, submission of specimen other than nasopharyngeal swab, presence of viral mutation(s) within the areas targeted by this assay, and inadequate number of viral copies(<138 copies/mL). A negative result must be combined with clinical observations, patient history, and epidemiological information. The expected result is Negative.  Fact Sheet for Patients:   BloggerCourse.com  Fact Sheet for Healthcare Providers:  SeriousBroker.it  This test is no t yet approved or cleared by the [place] FDA and  has been authorized for detection and/or diagnosis of SARS-CoV-2 by FDA under an Emergency Use Authorization (EUA). This EUA will remain  in effect (meaning this test can be used) for the duration of the COVID-19 declaration under Section 564(b)(1) of the Act, 21 U.S.C.section 360bbb-3(b)(1), unless the authorization is terminated  or revoked sooner.       Influenza A by PCR NEGATIVE NEGATIVE Final   Influenza B by PCR NEGATIVE NEGATIVE Final    Comment: (NOTE) The Xpert Xpress SARS-CoV-2/FLU/RSV plus assay is intended as an aid in the diagnosis of influenza from Nasopharyngeal swab specimens and should not be used as a sole basis for treatment. Nasal washings and aspirates are unacceptable for Xpert Xpress SARS-CoV-2/FLU/RSV testing.  Fact Sheet for Patients: BloggerCourse.com  Fact Sheet for Healthcare Providers: SeriousBroker.it  This test is not yet approved or cleared by the [place] FDA and has been authorized for detection and/or diagnosis of SARS-CoV-2 by FDA under an Emergency Use Authorization (EUA). This EUA will remain in effect (meaning this test can be used) for the duration of the COVID-19 declaration under Section 564(b)(1) of the Act, 21 U.S.C. section 360bbb-3(b)(1), unless the authorization is terminated or revoked.     Resp Syncytial Virus by PCR NEGATIVE NEGATIVE Final    Comment: (NOTE) Fact Sheet for Patients: BloggerCourse.com  Fact Sheet for Healthcare Providers: SeriousBroker.it  This test is not yet approved or cleared by the [place] FDA and has been authorized for detection and/or diagnosis of SARS-CoV-2 by FDA under an Emergency Use  Authorization (EUA). This EUA will remain in effect (meaning this test can be used) for the duration of the COVID-19 declaration under Section 564(b)(1) of the Act, 21 U.S.C. section 360bbb-3(b)(1), unless the authorization is terminated or revoked.  Performed at [facility], [address] [place], [place] [address]      Labs: BNP (last 3 results) No results for input(s): "BNP" in the last 8760 hours. Basic Metabolic Panel: Recent Labs  Lab 11/28/23 0052  NA 139  K 3.6  CL 101  CO2 20*  GLUCOSE 116*  BUN 18  CREATININE 1.23  CALCIUM 10.3   Liver Function Tests: Recent Labs  Lab 11/28/23 0052  AST 27  ALT 36  ALKPHOS 68  BILITOT 1.0  PROT 8.8*  ALBUMIN 4.5   Recent Labs  Lab 11/28/23 0052  LIPASE 24   No results for input(s): "AMMONIA" in the  last 168 hours. CBC: Recent Labs  Lab 11/28/23 0052  WBC 12.2*  HGB 15.7  HCT 48.4  MCV 88.2  PLT 487*   Cardiac Enzymes: No results for input(s): "CKTOTAL", "CKMB", "CKMBINDEX", "TROPONINI" in the last 168 hours. BNP: Invalid input(s): "POCBNP" CBG: Recent Labs  Lab 11/29/23 1958  GLUCAP 97   D-Dimer No results for input(s): "DDIMER" in the last 72 hours. Hgb A1c No results for input(s): "HGBA1C" in the last 72 hours. Lipid Profile No results for input(s): "CHOL", "HDL", "LDLCALC", "TRIG", "CHOLHDL", "LDLDIRECT" in the last 72 hours. Thyroid function studies No results for input(s): "TSH", "T4TOTAL", "T3FREE", "THYROIDAB" in the last 72 hours.  Invalid input(s): "FREET3" Anemia work up No results for input(s): "VITAMINB12", "FOLATE", "FERRITIN", "TIBC", "IRON", "RETICCTPCT" in the last 72 hours. Urinalysis    Component Value Date/Time   COLORURINE YELLOW (A) 11/28/2023 0756   APPEARANCEUR CLEAR (A) 11/28/2023 0756   LABSPEC >1.046 (H) 11/28/2023 0756   PHURINE 5.0 11/28/2023 0756   GLUCOSEU NEGATIVE 11/28/2023 0756   HGBUR SMALL (A) 11/28/2023 0756   BILIRUBINUR NEGATIVE 11/28/2023  0756   KETONESUR NEGATIVE 11/28/2023 0756   PROTEINUR NEGATIVE 11/28/2023 0756   NITRITE NEGATIVE 11/28/2023 0756   LEUKOCYTESUR NEGATIVE 11/28/2023 0756   Sepsis Labs Recent Labs  Lab 11/28/23 0052  WBC 12.2*   Microbiology Recent Results (from the past 240 hours)  Resp panel by RT-PCR (RSV, Flu A&B, Covid) Anterior Nasal Swab     Status: None   Collection Time: 11/28/23 12:52 AM   Specimen: Anterior Nasal Swab  Result Value Ref Range Status   SARS Coronavirus 2 by RT PCR NEGATIVE NEGATIVE Final    Comment: (NOTE) SARS-CoV-2 target nucleic acids are NOT DETECTED.  The SARS-CoV-2 RNA is generally detectable in upper respiratory specimens during the acute phase of infection. The lowest concentration of SARS-CoV-2 viral copies this assay can detect is 138 copies/mL. A negative result does not preclude SARS-Cov-2 infection and should not be used as the sole basis for treatment or other patient management decisions. A negative result may occur with  improper specimen collection/handling, submission of specimen other than nasopharyngeal swab, presence of viral mutation(s) within the areas targeted by this assay, and inadequate number of viral copies(<138 copies/mL). A negative result must be combined with clinical observations, patient history, and epidemiological information. The expected result is Negative.  Fact Sheet for Patients:  BloggerCourse.com  Fact Sheet for Healthcare Providers:  SeriousBroker.it  This test is no t yet approved or cleared by the [place] FDA and  has been authorized for detection and/or diagnosis of SARS-CoV-2 by FDA under an Emergency Use Authorization (EUA). This EUA will remain  in effect (meaning this test can be used) for the duration of the COVID-19 declaration under Section 564(b)(1) of the Act, 21 U.S.C.section 360bbb-3(b)(1), unless the authorization is terminated  or revoked sooner.        Influenza A by PCR NEGATIVE NEGATIVE Final   Influenza B by PCR NEGATIVE NEGATIVE Final    Comment: (NOTE) The Xpert Xpress SARS-CoV-2/FLU/RSV plus assay is intended as an aid in the diagnosis of influenza from Nasopharyngeal swab specimens and should not be used as a sole basis for treatment. Nasal washings and aspirates are unacceptable for Xpert Xpress SARS-CoV-2/FLU/RSV testing.  Fact Sheet for Patients: BloggerCourse.com  Fact Sheet for Healthcare Providers: SeriousBroker.it  This test is not yet approved or cleared by the [place] FDA and has been authorized for detection and/or diagnosis of SARS-CoV-2  by FDA under an Emergency Use Authorization (EUA). This EUA will remain in effect (meaning this test can be used) for the duration of the COVID-19 declaration under Section 564(b)(1) of the Act, 21 U.S.C. section 360bbb-3(b)(1), unless the authorization is terminated or revoked.     Resp Syncytial Virus by PCR NEGATIVE NEGATIVE Final    Comment: (NOTE) Fact Sheet for Patients: BloggerCourse.com  Fact Sheet for Healthcare Providers: SeriousBroker.it  This test is not yet approved or cleared by the [place] FDA and has been authorized for detection and/or diagnosis of SARS-CoV-2 by FDA under an Emergency Use Authorization (EUA). This EUA will remain in effect (meaning this test can be used) for the duration of the COVID-19 declaration under Section 564(b)(1) of the Act, 21 U.S.C. section 360bbb-3(b)(1), unless the authorization is terminated or revoked.  Performed at [facility], [address], [place], [place] [address]      Time coordinating discharge: Over 30 minutes  SIGNED:   Tresa Moore, MD  [facility] 11/30/2023, 11:42 AM Pager   If 7PM-7AM, please contact night-coverage

## 2023-11-30 NOTE — Progress Notes (Signed)
MOON letter mailed to pts legal guardian Lenor Derrick of [place] [employer] via certified mail.

## 2023-11-30 NOTE — TOC Transition Note (Signed)
Transition of Care [facility]) - Discharge Note   Patient Details  Name: Eugene Silva MRN: [id number] Date of Birth: Jan 06, 1991  [facility] CM/SW Contact:  Allena Katz, LCSW Phone Number: 11/30/2023, 12:05 PM   Clinical Narrative:   Pt discharging back to group home. Renee with group home states we can send fl2 and summary with patient. MD left message with legal guardian to let her know pt is being moved. Facility will transport.     Final next level of care: Group Home Barriers to Discharge: Barriers Resolved   Patient Goals and CMS Choice Patient states their goals for this hospitalization and ongoing recovery are:: return to group home          Discharge Placement                Patient to be transferred to facility by: faciltiy Name of family member notified: left message for guadian. Patient and family notified of of transfer: 11/30/23  Discharge Plan and Services Additional resources added to the After Visit Summary for                                       Social Drivers of Health (SDOH) Interventions SDOH Screenings   Food Insecurity: No Food Insecurity (11/29/2023)  Housing: Low Risk  (11/29/2023)  Transportation Needs: No Transportation Needs (11/29/2023)  Utilities: Not At Risk (11/29/2023)  Tobacco Use: Low Risk  (11/28/2023)     Readmission Risk Interventions     No data to display

## 2023-11-30 NOTE — Progress Notes (Signed)
[facility] SURGICAL PROGRESS NOTE (cpt [phone])  Hospital Day(s): 0.   Interval History: Patient seen and examined, no acute events or new complaints overnight. Patient reports he is doing well. No abdominal pain. No fever, chills, nausea, emesis, nor abdominal pain. No new labs. KUB still with foreign body in the right colon. No free air, no evidence of obstruction. He is having BM.   Review of Systems:  Constitutional: denies fever, chills  HEENT: denies cough or congestion  Respiratory: denies any shortness of breath  Cardiovascular: denies chest pain or palpitations  Gastrointestinal: denies abdominal pain, N/V Genitourinary: denies burning with urination or urinary frequency Musculoskeletal: denies pain, decreased motor or sensation  Vital signs in last 24 hours: [min-max] current  Temp:  [97.8 F (36.6 C)-98.7 F (37.1 C)] 97.9 F (36.6 C) (01/27 0506) Pulse Rate:  [68-89] 73 (01/27 0506) Resp:  [17-19] 18 (01/26 1942) BP: (106-129)/(72-80) 106/78 (01/27 0506) SpO2:  [96 %-100 %] 97 % (01/27 0506) Weight:  [81.5 kg] 81.5 kg (01/26 1400)     Height: 5\' 7"  (170.2 cm) Weight: 81.5 kg BMI (Calculated): 28.13   Intake/Output last 2 shifts:  01/26 0701 - 01/27 0700 In: 600 [P.O.:600] Out: -    Physical Exam:  Constitutional: alert, cooperative and no distress  HENT: normocephalic without obvious abnormality  Eyes: PERRL, EOM's grossly intact and symmetric  Respiratory: breathing non-labored at rest  Cardiovascular: regular rate and sinus rhythm  Gastrointestinal: soft, non-tender, and non-distended. No rebound/guarding. He is certainly without peritonitis.  Musculoskeletal: no edema or wounds, motor and sensation grossly intact, NT    Labs:     Latest Ref Rng & Units 11/28/2023   12:52 AM 11/26/2016    4:27 PM  CBC  WBC 4.0 - 10.5 K/uL 12.2  6.0   Hemoglobin 13.0 - 17.0 g/dL 60.4  54.0   Hematocrit 39.0 - 52.0 % 48.4  44.2   Platelets 150 - 400 K/uL  487  251       Latest Ref Rng & Units 11/28/2023   12:52 AM 11/26/2016    4:27 PM  CMP  Glucose 70 - 99 mg/dL 981  79   BUN 6 - 20 mg/dL 18  12   Creatinine 1.91 - 1.24 mg/dL 4.78  2.95   Sodium 621 - 145 mmol/L 139  139   Potassium 3.5 - 5.1 mmol/L 3.6  4.0   Chloride 98 - 111 mmol/L 101  103   CO2 22 - 32 mmol/L 20  30   Calcium 8.9 - 10.3 mg/dL 30.8  9.6   Total Protein 6.5 - 8.1 g/dL 8.8    Total Bilirubin 0.0 - 1.2 mg/dL 1.0    Alkaline Phos 38 - 126 U/L 68    AST 15 - 41 U/L 27    ALT 0 - 44 U/L 36      Imaging studies:   KUB (11/29/2022) personally reviewed showing continued foreign body in RLQ, similar position to yesterday, no free air, no evidence of obstruction, and radiologist report reviewed below:  IMPRESSION: 3.1 cm ovoid opaque foreign body in the right lower quadrant remains in the plane of either the terminal ileum or cecum.   It is slightly more cephalad than on yesterday's exam and is now above the level of the iliac crests, was previously superimposed over the right iliac crest.   No upstream bowel dilatation is seen.   Assessment/Plan:  33 y.o. male with swallowed foreign body   -  Object appears in similar position on today's KUB. No clinical or radiographic evidence of perforation nor obstruction. He is having bowel function.  - We can advance diet as tolerated; soft diet ordered.  - No indication for surgical intervention  - Monitor abdominal examination; on-going bowel function   - Can consider bowel regimen to help this pass although he is already having bowel movements   - Further management per primary service  - Discharge Planning: Can consider discharge home if tolerates a diet with outpatient KUB in 1-2 weeks vs continued observation with daily KUB until passage. General surgery will be happy to remain available should clinical condition change.   All of the above findings and recommendations were discussed with the patient, and the medical  team, and all of patient's questions were answered to his expressed satisfaction.   -- Lynden Oxford, PA-C [facility] 11/30/2023, 7:10 AM M-F: 7am - 4pm

## 2023-11-30 NOTE — NC FL2 (Signed)
  [place] MEDICAID FL2 LEVEL OF CARE FORM     IDENTIFICATION  Patient Name: Eugene Silva Birthdate: [birth year] Sex: male Admission Date (Current Location): 11/28/2023  [place] and [place] Number:  Chiropodist and Address:  [facility], [address] [place], [place] [address]      Provider Number: [id number]  Attending Physician Name and Address:  Tresa Moore, MD  Relative Name and Phone Number:  Iverson Alamin (Other)  [phone]    Current Level of Care: Hospital Recommended Level of Care:  (Group Home) Prior Approval Number:    Date Approved/Denied:   PASRR Number:    Discharge Plan:  (group home)    Current Diagnoses: Patient Active Problem List   Diagnosis Date Noted   Foreign body in ileum 11/28/2023   Periumbilical abdominal pain 11/28/2023    Orientation RESPIRATION BLADDER Height & Weight     Time, Self, Place  Normal Continent Weight: 179 lb 10.8 oz (81.5 kg) Height:  5\' 7"  (170.2 cm)  BEHAVIORAL SYMPTOMS/MOOD NEUROLOGICAL BOWEL NUTRITION STATUS      Continent    AMBULATORY STATUS COMMUNICATION OF NEEDS Skin     Verbally Normal                       Personal Care Assistance Level of Assistance              Functional Limitations Info             SPECIAL CARE FACTORS FREQUENCY                       Contractures Contractures Info: Not present    Additional Factors Info  Code Status Code Status Info: FULL             Current Medications (11/30/2023):  This is the current hospital active medication list Current Facility-Administered Medications  Medication Dose Route Frequency Provider Last Rate Last Admin   acetaminophen (TYLENOL) tablet 650 mg  650 mg Oral Q6H PRN Mikey College T, MD       Or   acetaminophen (TYLENOL) suppository 650 mg  650 mg Rectal Q6H PRN Mikey College T, MD       famotidine (PEPCID) tablet 20 mg  20 mg Oral Daily Mikey College T, MD   20 mg at  11/30/23 0919   ketorolac (TORADOL) 30 MG/ML injection 30 mg  30 mg Intravenous Q6H PRN Mikey College T, MD   30 mg at 11/28/23 2246   ondansetron (ZOFRAN) injection 4 mg  4 mg Intravenous Q6H PRN Mikey College T, MD       polyethylene glycol (MIRALAX / GLYCOLAX) packet 17 g  17 g Oral Daily PRN Emeline General, MD         Discharge Medications: Please see discharge summary for a list of discharge medications.  Relevant Imaging Results:  Relevant Lab Results:   Additional Information SS [id number]  Allena Katz, LCSW

## 2023-12-15 ENCOUNTER — Ambulatory Visit (INDEPENDENT_AMBULATORY_CARE_PROVIDER_SITE_OTHER): Payer: Medicare HMO | Admitting: General Surgery

## 2023-12-15 ENCOUNTER — Ambulatory Visit
Admission: RE | Admit: 2023-12-15 | Discharge: 2023-12-15 | Disposition: A | Discharge status: Home / Self Care | Payer: Medicare HMO | Source: Ambulatory Visit | Attending: Physician Assistant | Admitting: Physician Assistant

## 2023-12-15 ENCOUNTER — Encounter: Payer: Self-pay | Admitting: General Surgery

## 2023-12-15 ENCOUNTER — Ambulatory Visit
Admission: RE | Admit: 2023-12-15 | Discharge: 2023-12-15 | Disposition: A | Discharge status: Home / Self Care | Payer: Medicare HMO | Source: Ambulatory Visit | Attending: Physician Assistant | Admitting: *Deleted

## 2023-12-15 VITALS — BP 117/80 | HR 78 | Temp 98.6°F | Wt 179.8 lb

## 2023-12-15 DIAGNOSIS — T183XXA Foreign body in small intestine, initial encounter: Secondary | ICD-10-CM | POA: Insufficient documentation

## 2023-12-15 DIAGNOSIS — T183XXD Foreign body in small intestine, subsequent encounter: Secondary | ICD-10-CM | POA: Diagnosis not present

## 2023-12-15 NOTE — Progress Notes (Signed)
Outpatient Surgical Follow Up CC: Swallowed foreign body 12/15/2023  Eugene Silva is an 33 y.o. male.   Chief Complaint  Patient presents with   New Patient (Initial Visit)    Swallow foreign body    HPI: The patient returns today after being seen in the hospital for swallowed foreign body.  He had a repeat x-ray that showed no there is no more retained foreign body.  He reports that he is tolerating a diet and having normal bowel function.  Past Medical History:  Diagnosis Date   Anxiety    Depression    Traumatic brain injury [facility])     History reviewed. No pertinent surgical history.  History reviewed. No pertinent family history.  Social History:  reports that he has never smoked. He has never used smokeless tobacco. He reports that he does not drink alcohol and does not use drugs.  Allergies: No Known Allergies  Medications reviewed.    ROS Full ROS performed and is otherwise negative other than what is stated in HPI   BP 117/80   Pulse 78   Temp 98.6 F (37 C) (Oral)   Wt 179 lb 12.8 oz (81.6 kg)   SpO2 96%   BMI 28.16 kg/m   Physical Exam  Abdomen soft, nontender and nondistended   X-rays reviewed and there is no longer a retained foreign body within the abdomen.  Assessment/Plan:  Patient seen in the hospital for a swallowed foreign body on CT and an x-ray it with abdominal pain.  Subsequent x-rays showed that it had moved forward through to the colon.  He had a repeat x-ray today that did not show that there was any foreign body within the abdomen.  He is doing well and can follow-up.   Baker Pierini, M.D. [facility]

## 2023-12-15 NOTE — Patient Instructions (Signed)
Swallowed Foreign Body, Adult    A swallowed foreign body means that you swallowed something and it got stuck. It might be food or something else. The object may get stuck in the part of your body that moves food from your mouth to your stomach (esophagus), or it may get stuck in another part of your belly (digestive tract). Often, the object will pass through your body on its own. In some cases, the object may need to be taken out by a doctor.  It is very important to tell your doctor what you swallowed. Some swallowed objects can be very dangerous. You may need emergency treatment if:  An object gets stuck in your throat.  You cannot swallow.  You cannot breathe well.  The object is sharp.  The object is harmful or poisonous (toxic).  What are the causes?  The most common cause is food that will not pass through the part of your body that moves food from your mouth to your stomach. Foods that this may happen with include:  Meat.  Hard vegetables, such as carrots and radishes.  Other common swallowed objects include:  Pieces of bone from meat or fish.  Toothpicks.  Dentures.  What increases the risk?  Wearing dentures.  Drinking alcohol or taking drugs.  Having a mental health condition.  Having trouble with thinking and learning (cognitive impairment).  Having a narrowed or scarred area in your belly.  What are the signs or symptoms?  Pain or pressure in your throat or chest.  Not being able to swallow food, liquid, or your saliva.  Drooling.  Choking.  A hoarse voice.  Trouble breathing or noisy breathing.  Vomit that has blood in it.  How is this treated?  No treatment is needed if the object is not dangerous and will come out (pass) in your poop (stool).  If the swallowed object is not dangerous, but it is stuck in the part of your body that moves food from your mouth to your stomach:  Your doctor may gently suction out the object through your mouth.  You may be given a medicine to relax the muscles and allow  the object to pass through to the stomach.  A procedure called endoscopy may be done to find and take out the object if it does not come out with medicine. Your doctor will put medical tools through a tube (endoscope) to take out the object.  You may need emergency treatment if:  The object is causing you to breathe saliva into your lungs (aspirate).  The object is pressing on your airway. This makes it hard for you to breathe.  The object can harm the inside of your belly. Some objects that can cause harm include batteries, magnets, sharp objects, and drugs.  Follow these instructions at home:  Caring for yourself  If the object in your belly is expected to come out in your poop:  Eat what you normally eat if your doctor says that you can.  Keep checking your poop to see if the object has come out of your body.  If you had a procedure to take out the object, follow instructions from your doctor about caring for yourself after the procedure.  General instructions  Take over-the-counter and prescription medicines only as told by your doctor.  Keep all follow-up visits, including any for repeat tests.  How is this prevented?  Cut your food into small pieces.  Always sit upright while eating.  Remove bones  from meat and fish.  Chew your food well before swallowing.  Do not talk, laugh, or walk around while eating or swallowing.  Contact a doctor if:  You still have problems after you have been treated.  The object has not come out of your body after 3 days.  Get help right away if:  You have a fever.  You have pain in your chest or your belly.  You cough up blood.  You have blood in your poop.  You have blood in your vomit after treatment.  These symptoms may be an emergency. Get help right away. Call 911.  Do not wait to see if the symptoms will go away.  Do not drive yourself to the hospital.  Summary  A swallowed foreign body means that you swallowed something and it got stuck.  The object may get stuck in the part  of the body that moves food from your mouth to your stomach, or it may get stuck in another part of your belly.  Often, the object will pass through your body on its own.  An endoscopy may be done to find and take out the object if it does not pass out of your body after you take medicine.  Call your doctor if the object has not come out of your body after 3 days, you have blood in your poop, or you have blood when you cough or vomit.  This information is not intended to replace advice given to you by your health care provider. Make sure you discuss any questions you have with your health care provider.  Document Revised: 11/21/2021 Document Reviewed: 11/21/2021  Elsevier Patient Education  2024 [employer].

## 2024-01-07 ENCOUNTER — Encounter: Payer: Self-pay | Admitting: Emergency Medicine

## 2024-01-07 ENCOUNTER — Emergency Department

## 2024-01-07 ENCOUNTER — Emergency Department
Admission: EM | Admit: 2024-01-07 | Discharge: 2024-01-08 | Disposition: A | Discharge status: Home / Self Care | Attending: Emergency Medicine | Admitting: Emergency Medicine

## 2024-01-07 ENCOUNTER — Other Ambulatory Visit: Payer: Self-pay

## 2024-01-07 DIAGNOSIS — R112 Nausea with vomiting, unspecified: Secondary | ICD-10-CM | POA: Diagnosis present

## 2024-01-07 DIAGNOSIS — R1011 Right upper quadrant pain: Secondary | ICD-10-CM | POA: Insufficient documentation

## 2024-01-07 DIAGNOSIS — R7401 Elevation of levels of liver transaminase levels: Secondary | ICD-10-CM | POA: Diagnosis not present

## 2024-01-07 DIAGNOSIS — D72829 Elevated white blood cell count, unspecified: Secondary | ICD-10-CM | POA: Insufficient documentation

## 2024-01-07 LAB — CBC
HCT: 48.3 % (ref 39.0–52.0)
Hemoglobin: 15.7 g/dL (ref 13.0–17.0)
MCH: 29 pg (ref 26.0–34.0)
MCHC: 32.5 g/dL (ref 30.0–36.0)
MCV: 89.1 fL (ref 80.0–100.0)
Platelets: 464 10*3/uL — ABNORMAL HIGH (ref 150–400)
RBC: 5.42 MIL/uL (ref 4.22–5.81)
RDW: 14.2 % (ref 11.5–15.5)
WBC: 11.8 10*3/uL — ABNORMAL HIGH (ref 4.0–10.5)
nRBC: 0 % (ref 0.0–0.2)

## 2024-01-07 LAB — VALPROIC ACID LEVEL: Valproic Acid Lvl: <10 ug/mL — ABNORMAL LOW (ref 50.0–100.0)

## 2024-01-07 LAB — URINALYSIS, ROUTINE W REFLEX MICROSCOPIC
Bacteria, UA: NONE SEEN
Bilirubin Urine: NEGATIVE
Glucose, UA: NEGATIVE mg/dL
Hgb urine dipstick: NEGATIVE
Ketones, ur: NEGATIVE mg/dL
Leukocytes,Ua: NEGATIVE
Nitrite: NEGATIVE
Protein, ur: 100 mg/dL — AB
Specific Gravity, Urine: 1.03 (ref 1.005–1.030)
pH: 6 (ref 5.0–8.0)

## 2024-01-07 LAB — COMPREHENSIVE METABOLIC PANEL
ALT: 46 U/L — ABNORMAL HIGH (ref 0–44)
AST: 34 U/L (ref 15–41)
Albumin: 5 g/dL (ref 3.5–5.0)
Alkaline Phosphatase: 55 U/L (ref 38–126)
Anion gap: 13 (ref 5–15)
BUN: 19 mg/dL (ref 6–20)
CO2: 24 mmol/L (ref 22–32)
Calcium: 10.1 mg/dL (ref 8.9–10.3)
Chloride: 102 mmol/L (ref 98–111)
Creatinine, Ser: 1.03 mg/dL (ref 0.61–1.24)
GFR, Estimated: >60 mL/min (ref >60)
Glucose, Bld: 106 mg/dL — ABNORMAL HIGH (ref 70–99)
Potassium: 3.7 mmol/L (ref 3.5–5.1)
Sodium: 139 mmol/L (ref 135–145)
Total Bilirubin: 0.8 mg/dL (ref 0.0–1.2)
Total Protein: 8.8 g/dL — ABNORMAL HIGH (ref 6.5–8.1)

## 2024-01-07 LAB — RESP PANEL BY RT-PCR (RSV, FLU A&B, COVID)  RVPGX2
Influenza A by PCR: NEGATIVE
Influenza B by PCR: NEGATIVE
Resp Syncytial Virus by PCR: NEGATIVE
SARS Coronavirus 2 by RT PCR: NEGATIVE

## 2024-01-07 LAB — LIPASE, BLOOD: Lipase: 25 U/L (ref 11–51)

## 2024-01-07 MED ORDER — ONDANSETRON 4 MG PO TBDP: 4.0000 mg | ORAL_TABLET | Freq: Three times a day (TID) | ORAL | 0 refills | Status: AC | PRN

## 2024-01-07 MED ORDER — METOCLOPRAMIDE HCL 5 MG/ML IJ SOLN
10.0000 mg | Freq: Once | INTRAMUSCULAR | Status: AC
  Administered 2024-01-07: 10 mg via INTRAVENOUS
  Filled 2024-01-07: qty 2

## 2024-01-07 MED ORDER — IOHEXOL 300 MG/ML  SOLN
100.0000 mL | Freq: Once | INTRAMUSCULAR | Status: AC | PRN
  Administered 2024-01-07: 100 mL via INTRAVENOUS

## 2024-01-07 MED ORDER — HYDROXYZINE HCL 25 MG PO TABS
25.0000 mg | ORAL_TABLET | Freq: Once | ORAL | Status: AC
  Administered 2024-01-07: 25 mg via ORAL
  Filled 2024-01-07: qty 1

## 2024-01-07 MED ORDER — ONDANSETRON HCL 4 MG/2ML IJ SOLN
4.0000 mg | Freq: Once | INTRAMUSCULAR | Status: AC
  Administered 2024-01-07: 4 mg via INTRAVENOUS
  Filled 2024-01-07: qty 2

## 2024-01-07 MED ORDER — SODIUM CHLORIDE 0.9 % IV BOLUS
1000.0000 mL | Freq: Once | INTRAVENOUS | Status: AC
  Administered 2024-01-07: 1000 mL via INTRAVENOUS
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
## 2024-01-07 NOTE — ED Triage Notes (Signed)
 First Nurse Note: Patient to ED via [facility] from [facility] for vomiting since Tuesday- after eating. States only having abd pain when vomiting. Sick contacts at group home.   BP 138/72 HR 90 95% RA

## 2024-01-07 NOTE — ED Provider Notes (Signed)
 [facility] Provider Note    Event Date/Time   First MD Initiated Contact with Patient 01/07/24 1616     (approximate)   History   Emesis   HPI  Eugene Silva is a 32 y.o. male who comes in from the group home with concern for nausea, vomiting since Tuesday patient reports that someone else in the group home is also been sick.  I reviewed patient's discharge summary from 11/28/2023 until 11/30/2023 with a foreign body noted in his abdomen.  X-ray done on 2/23 showed clearance of the object.  Did discuss this with patient he states that he did eat a glass ball but he denies ingesting any foreign objects today.  He reports that he has been sick for the past 4 days with nausea, vomiting.  He does think other people at his facility have also been sick.  Denies any diarrhea.  He denies any lower abdominal pain.  He reports only a little bit of pain with vomiting a little bit of right upper quadrant pain.  Physical Exam   Triage Vital Signs: ED Triage Vitals  Encounter Vitals Group     BP 01/07/24 1556 118/87     Systolic BP Percentile --      Diastolic BP Percentile --      Pulse Rate 01/07/24 1556 100     Resp 01/07/24 1556 18     Temp 01/07/24 1556 98.9 F (37.2 C)     Temp Source 01/07/24 1556 Oral     SpO2 01/07/24 1556 96 %     Weight 01/07/24 1603 170 lb (77.1 kg)     Height 01/07/24 1603 5\' 7"  (1.702 m)     Head Circumference --      Peak Flow --      Pain Score 01/07/24 1603 0     Pain Loc --      Pain Education --      Exclude from Growth Chart --     Most recent vital signs: Vitals:   01/07/24 1556  BP: 118/87  Pulse: 100  Resp: 18  Temp: 98.9 F (37.2 C)  SpO2: 96%     General: Awake, no distress.  CV:  Good peripheral perfusion.  Resp:  Normal effort.  Abd:  No distention.  Slight tenderness in the right upper abdomen.  No lower abdominal tenderness. Other:     ED Results / Procedures / Treatments   Labs (all  labs ordered are listed, but only abnormal results are displayed) Labs Reviewed  LIPASE, BLOOD  COMPREHENSIVE METABOLIC PANEL  CBC  URINALYSIS, ROUTINE W REFLEX MICROSCOPIC     RADIOLOGY I have reviewed the xray personally and interpreted no evidence of foreign body   PROCEDURES:  Critical Care performed: No  Procedures   MEDICATIONS ORDERED IN ED: Medications  sodium chloride 0.9 % bolus 1,000 mL (1,000 mLs Intravenous New Bag/Given 01/07/24 1701)  ondansetron (ZOFRAN) injection 4 mg (4 mg Intravenous Given 01/07/24 1701)     IMPRESSION / MDM / ASSESSMENT AND PLAN / ED COURSE  I reviewed the triage vital signs and the nursing notes.   Patient's presentation is most consistent with acute presentation with potential threat to life or bodily function.  Patient comes in with group home with nausea, vomiting with other people in the home being sick but given his history of having swallowed foreign bodies and denied it we will get x-ray to evaluate for any foreign body.  He does  also have a little bit of right upper quadrant tenderness and his LFTs are slightly elevated will get ultrasound to rule out cholecystitis.  He is got no lower abdominal tenderness to suggest appendicitis.  CBC shows slightly elevated white count of 11.8. UA without evidence of UTI.  COVID, flu were negative.  CMP shows slight elevation of ALT.PATIENT DENIES ANY EXCESSIVE TYLENOL USE, DRUG USE, ALCOHOL USE.  CBC shows elevated white count   Ultrasound shows some hepatic steatosis x-ray was negative.  Patient reporting some continued pain, nausea, vomiting will get CT imaging  On repeat assessment patient is not the best historian he continues to report nausea, vomiting.  Will get CT imaging to rule out any other acute pathology and if negative patient can be discharged home.  Repeat assessment patient tolerating p.o.  Patient instructed that he will go home if CT, Depakote levels are reassuring.  He  expressed understanding    FINAL CLINICAL IMPRESSION(S) / ED DIAGNOSES   Final diagnoses:  Nausea and vomiting, unspecified vomiting type     Rx / DC Orders   ED Discharge Orders          Ordered    ondansetron (ZOFRAN-ODT) 4 MG disintegrating tablet  Every 8 hours PRN        01/07/24 2331             Note:  This document was prepared using Dragon voice recognition software and may include unintentional dictation errors.   Concha Se, MD 01/07/24 [phone]

## 2024-01-07 NOTE — Discharge Instructions (Addendum)
 Your CT imaging was reassuring suspect this is most likely related to a viral illness.  You can return to the ER if you develop worsening symptoms or any other concerns.  You are incidentally noted to have a little bit of hepatic steatosis on your ultrasound this is some mild inflammation of your liver.  You can call her GI doctor to make follow-up for this.  Avoid any alcohol, limit tylenol use.   Your depakote level was low. Take your medications as prescribed and follow up with your doctor.    IMPRESSION: Hepatic steatosis.

## 2024-01-08 NOTE — ED Notes (Signed)
 Contacted an employee of the group home who said he would call a co-worker to come and pick the patient up.

## 2024-02-06 NOTE — Progress Notes (Signed)
 Giving vomiting- viral panel ordered to evaluate for cause

## 2024-10-12 ENCOUNTER — Emergency Department
Admission: EM | Admit: 2024-10-12 | Discharge: 2024-10-12 | Disposition: A | Discharge status: Home / Self Care | Attending: Emergency Medicine | Admitting: Emergency Medicine

## 2024-10-12 ENCOUNTER — Other Ambulatory Visit: Payer: Self-pay

## 2024-10-12 ENCOUNTER — Encounter: Payer: Self-pay | Admitting: Emergency Medicine

## 2024-10-12 ENCOUNTER — Emergency Department

## 2024-10-12 DIAGNOSIS — R0789 Other chest pain: Secondary | ICD-10-CM | POA: Diagnosis present

## 2024-10-12 MED ORDER — LIDOCAINE 5 % EX PTCH
1.0000 | MEDICATED_PATCH | CUTANEOUS | Status: DC
  Administered 2024-10-12: 1 via TRANSDERMAL
  Filled 2024-10-12: qty 1

## 2024-10-12 MED ORDER — CYCLOBENZAPRINE HCL 5 MG PO TABS: 5.0000 mg | ORAL_TABLET | Freq: Three times a day (TID) | ORAL | 0 refills | Status: AC | PRN

## 2024-10-12 MED ORDER — CYCLOBENZAPRINE HCL 10 MG PO TABS
5.0000 mg | ORAL_TABLET | Freq: Once | ORAL | Status: AC
  Administered 2024-10-12: 5 mg via ORAL
  Filled 2024-10-12: qty 1

## 2024-10-12 MED ORDER — IBUPROFEN 800 MG PO TABS
800.0000 mg | ORAL_TABLET | Freq: Once | ORAL | Status: AC
  Administered 2024-10-12: 800 mg via ORAL
  Filled 2024-10-12: qty 1
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
## 2024-10-12 NOTE — Discharge Instructions (Addendum)
 Your workup in the ED today was reassuring.  Your EKG is normal.  Your chest x-ray is normal.  Perform deep breathing exercises at home.  Alternate Tylenol  and ibuprofen  for pain as needed.  Apply warm heating pads over the area to help with healing.  Follow-up with your primary care provider as needed.  Pain control:  Ibuprofen  (motrin /aleve/advil ) - You can take 3 tablets (600 mg) every 6 hours as needed for pain.  Acetaminophen  (tylenol ) - You can take 2 extra strength tablets (1000 mg) every 6 hours as needed for pain.  You can alternate these medications or take them together.  Make sure you eat food/drink water when taking these medications.
